# Patient Record
Sex: Female | Born: 1935 | Race: White | Hispanic: No | Smoking: Former smoker
Health system: Southern US, Community
[De-identification: ages and names within clinical notes are randomized; demographics above are authoritative.]

## PROBLEM LIST (undated history)

## (undated) DIAGNOSIS — G250 Essential tremor: Secondary | ICD-10-CM

## (undated) DIAGNOSIS — G459 Transient cerebral ischemic attack, unspecified: Secondary | ICD-10-CM

## (undated) DIAGNOSIS — M1990 Unspecified osteoarthritis, unspecified site: Secondary | ICD-10-CM

## (undated) DIAGNOSIS — N63 Unspecified lump in unspecified breast: Secondary | ICD-10-CM

## (undated) DIAGNOSIS — J449 Chronic obstructive pulmonary disease, unspecified: Secondary | ICD-10-CM

## (undated) DIAGNOSIS — F32A Depression, unspecified: Secondary | ICD-10-CM

## (undated) DIAGNOSIS — K219 Gastro-esophageal reflux disease without esophagitis: Secondary | ICD-10-CM

## (undated) DIAGNOSIS — F329 Major depressive disorder, single episode, unspecified: Secondary | ICD-10-CM

## (undated) DIAGNOSIS — N390 Urinary tract infection, site not specified: Secondary | ICD-10-CM

## (undated) DIAGNOSIS — J302 Other seasonal allergic rhinitis: Secondary | ICD-10-CM

## (undated) DIAGNOSIS — K5790 Diverticulosis of intestine, part unspecified, without perforation or abscess without bleeding: Secondary | ICD-10-CM

## (undated) DIAGNOSIS — I10 Essential (primary) hypertension: Secondary | ICD-10-CM

## (undated) DIAGNOSIS — E0590 Thyrotoxicosis, unspecified without thyrotoxic crisis or storm: Secondary | ICD-10-CM

## (undated) DIAGNOSIS — Z83518 Family history of other specified eye disorder: Secondary | ICD-10-CM

## (undated) HISTORY — PX: NEUROMA SURGERY: SHX722

## (undated) HISTORY — PX: OTHER SURGICAL HISTORY: SHX169

## (undated) HISTORY — DX: Family history of other specified eye disorder: Z83.518

## (undated) HISTORY — DX: Major depressive disorder, single episode, unspecified: F32.9

## (undated) HISTORY — DX: Unspecified osteoarthritis, unspecified site: M19.90

## (undated) HISTORY — DX: Chronic obstructive pulmonary disease, unspecified: J44.9

## (undated) HISTORY — PX: BREAST LUMPECTOMY: SHX2

## (undated) HISTORY — PX: BREAST CYST ASPIRATION: SHX578

## (undated) HISTORY — DX: Other seasonal allergic rhinitis: J30.2

## (undated) HISTORY — DX: Depression, unspecified: F32.A

## (undated) HISTORY — DX: Urinary tract infection, site not specified: N39.0

## (undated) HISTORY — DX: Unspecified lump in unspecified breast: N63.0

## (undated) HISTORY — DX: Transient cerebral ischemic attack, unspecified: G45.9

## (undated) HISTORY — DX: Thyrotoxicosis, unspecified without thyrotoxic crisis or storm: E05.90

## (undated) HISTORY — PX: DILATION AND CURETTAGE OF UTERUS: SHX78

## (undated) HISTORY — PX: APPENDECTOMY: SHX54

## (undated) HISTORY — DX: Diverticulosis of intestine, part unspecified, without perforation or abscess without bleeding: K57.90

## (undated) HISTORY — DX: Essential tremor: G25.0

## (undated) HISTORY — DX: Essential (primary) hypertension: I10

## (undated) HISTORY — DX: Gastro-esophageal reflux disease without esophagitis: K21.9

---

## 1973-01-26 HISTORY — PX: VESICOVAGINAL FISTULA CLOSURE W/ TAH: SUR271

## 1974-01-26 HISTORY — PX: OTHER SURGICAL HISTORY: SHX169

## 1975-01-27 HISTORY — PX: OTHER SURGICAL HISTORY: SHX169

## 1988-01-27 HISTORY — PX: TUMOR REMOVAL: SHX12

## 1998-01-26 HISTORY — PX: NECK SURGERY: SHX720

## 2004-10-03 ENCOUNTER — Ambulatory Visit: Payer: Self-pay | Admitting: Family Medicine

## 2005-10-29 ENCOUNTER — Ambulatory Visit: Payer: Self-pay | Admitting: Family Medicine

## 2006-01-26 HISTORY — PX: CATARACT EXTRACTION: SUR2

## 2006-06-09 ENCOUNTER — Ambulatory Visit: Payer: Self-pay | Admitting: Unknown Physician Specialty

## 2006-08-09 ENCOUNTER — Ambulatory Visit: Payer: Self-pay | Admitting: Ophthalmology

## 2006-08-09 ENCOUNTER — Other Ambulatory Visit: Payer: Self-pay

## 2006-08-16 ENCOUNTER — Ambulatory Visit: Payer: Self-pay | Admitting: Ophthalmology

## 2006-10-12 ENCOUNTER — Ambulatory Visit: Payer: Self-pay | Admitting: Ophthalmology

## 2006-10-18 ENCOUNTER — Ambulatory Visit: Payer: Self-pay | Admitting: Ophthalmology

## 2006-11-11 ENCOUNTER — Ambulatory Visit: Payer: Self-pay | Admitting: Family Medicine

## 2006-11-19 ENCOUNTER — Ambulatory Visit: Payer: Self-pay | Admitting: Family Medicine

## 2007-04-19 ENCOUNTER — Ambulatory Visit: Payer: Self-pay | Admitting: General Surgery

## 2007-12-28 ENCOUNTER — Ambulatory Visit: Payer: Self-pay | Admitting: Family Medicine

## 2008-01-11 ENCOUNTER — Ambulatory Visit: Payer: Self-pay | Admitting: Family Medicine

## 2009-01-14 ENCOUNTER — Ambulatory Visit: Payer: Self-pay | Admitting: Family Medicine

## 2009-01-17 ENCOUNTER — Ambulatory Visit: Payer: Self-pay | Admitting: Family Medicine

## 2009-06-28 ENCOUNTER — Emergency Department: Payer: Self-pay | Admitting: Internal Medicine

## 2009-09-10 ENCOUNTER — Ambulatory Visit: Payer: Self-pay | Admitting: General Surgery

## 2010-02-25 ENCOUNTER — Ambulatory Visit: Payer: Self-pay | Admitting: Family Medicine

## 2010-04-27 HISTORY — PX: RADIOFREQUENCY ABLATION: SHX2290

## 2010-04-29 ENCOUNTER — Ambulatory Visit: Payer: Self-pay

## 2010-05-12 ENCOUNTER — Ambulatory Visit: Payer: Self-pay

## 2010-05-28 ENCOUNTER — Inpatient Hospital Stay: Payer: Self-pay | Admitting: Specialist

## 2010-11-04 ENCOUNTER — Ambulatory Visit: Payer: Self-pay | Admitting: Ophthalmology

## 2011-01-15 ENCOUNTER — Emergency Department: Payer: Self-pay | Admitting: *Deleted

## 2011-01-23 ENCOUNTER — Emergency Department: Payer: Self-pay | Admitting: Emergency Medicine

## 2011-03-03 ENCOUNTER — Institutional Professional Consult (permissible substitution): Payer: Self-pay | Admitting: Critical Care Medicine

## 2011-03-05 ENCOUNTER — Encounter: Payer: Self-pay | Admitting: Pulmonary Disease

## 2011-03-06 ENCOUNTER — Ambulatory Visit (INDEPENDENT_AMBULATORY_CARE_PROVIDER_SITE_OTHER)
Admission: RE | Admit: 2011-03-06 | Discharge: 2011-03-06 | Disposition: A | Discharge status: Home / Self Care | Payer: Medicare Other | Source: Ambulatory Visit | Attending: Critical Care Medicine | Admitting: Critical Care Medicine

## 2011-03-06 ENCOUNTER — Encounter: Payer: Self-pay | Admitting: Critical Care Medicine

## 2011-03-06 ENCOUNTER — Ambulatory Visit (INDEPENDENT_AMBULATORY_CARE_PROVIDER_SITE_OTHER): Payer: Medicare Other | Admitting: Critical Care Medicine

## 2011-03-06 DIAGNOSIS — N390 Urinary tract infection, site not specified: Secondary | ICD-10-CM | POA: Insufficient documentation

## 2011-03-06 DIAGNOSIS — G250 Essential tremor: Secondary | ICD-10-CM

## 2011-03-06 DIAGNOSIS — F329 Major depressive disorder, single episode, unspecified: Secondary | ICD-10-CM

## 2011-03-06 DIAGNOSIS — K5792 Diverticulitis of intestine, part unspecified, without perforation or abscess without bleeding: Secondary | ICD-10-CM | POA: Insufficient documentation

## 2011-03-06 DIAGNOSIS — J309 Allergic rhinitis, unspecified: Secondary | ICD-10-CM

## 2011-03-06 DIAGNOSIS — E0590 Thyrotoxicosis, unspecified without thyrotoxic crisis or storm: Secondary | ICD-10-CM

## 2011-03-06 DIAGNOSIS — I10 Essential (primary) hypertension: Secondary | ICD-10-CM

## 2011-03-06 DIAGNOSIS — K5732 Diverticulitis of large intestine without perforation or abscess without bleeding: Secondary | ICD-10-CM

## 2011-03-06 DIAGNOSIS — J449 Chronic obstructive pulmonary disease, unspecified: Secondary | ICD-10-CM | POA: Insufficient documentation

## 2011-03-06 DIAGNOSIS — M1990 Unspecified osteoarthritis, unspecified site: Secondary | ICD-10-CM | POA: Insufficient documentation

## 2011-03-06 DIAGNOSIS — G459 Transient cerebral ischemic attack, unspecified: Secondary | ICD-10-CM

## 2011-03-06 DIAGNOSIS — K219 Gastro-esophageal reflux disease without esophagitis: Secondary | ICD-10-CM | POA: Insufficient documentation

## 2011-03-06 DIAGNOSIS — J302 Other seasonal allergic rhinitis: Secondary | ICD-10-CM | POA: Insufficient documentation

## 2011-03-06 MED ORDER — IPRATROPIUM-ALBUTEROL 0.5-2.5 (3) MG/3ML IN SOLN: RESPIRATORY_TRACT | Status: DC

## 2011-03-06 MED ORDER — AMLODIPINE BESYLATE 5 MG PO TABS: ORAL_TABLET | ORAL | Status: DC

## 2011-03-06 MED ORDER — NITROFURANTOIN MONOHYD MACRO 100 MG PO CAPS: ORAL_CAPSULE | ORAL | Status: DC

## 2011-03-06 NOTE — Progress Notes (Signed)
Subjective:    Patient ID: [name], female    DOB: [birth year], 78 y.o.   MRN: [id number]  Shortness of Breath This is a chronic problem. The current episode started more than 1 year ago. The problem occurs constantly. The problem has been gradually worsening. Associated symptoms include leg swelling, orthopnea, PND, sputum production and wheezing. Pertinent negatives include no abdominal pain, chest pain, claudication, coryza, ear pain, fever, headaches, hemoptysis, leg pain, rhinorrhea or sore throat. The symptoms are aggravated by any activity, exercise and weather changes. Risk factors include smoking. She has tried beta agonist inhalers and ipratropium inhalers for the symptoms. The treatment provided moderate relief. Her past medical history is significant for chronic lung disease, COPD and pneumonia. There is no history of allergies, aspirin allergies, asthma, bronchiolitis, CAD, DVT, a heart failure, PE or a recent surgery.   Dx copd since 2002 On oxygen at night for 1.5 years,  Then in ED 12/2010 now 24/7    Past Medical History  Diagnosis Date  . Seasonal allergies   . Osteoarthritis     hands, cervical spine, hips  . Benign breast lumps     and cysts  . FH: cataracts   . Depression   . Diverticulosis   . Hypertension   . Hyperthyroidism     s/p ablation  . Depression   . TIA (transient ischemic attack)   . COPD (chronic obstructive pulmonary disease)   . GERD (gastroesophageal reflux disease)   . Essential tremor   . Chronic UTI      Family History  Problem Relation Age of Onset  . Hypertension Father   . Stroke Father   . Heart disease Mother   . Allergies    . Asthma Mother     as a child  . Heart disease      grandparents  . Hypertension      grandparents  . Cancer Brother     spine (sibling)  . Emphysema Brother   . Breast cancer Daughter   . Breast cancer Daughter      History   Social History  . Marital Status: Single    Spouse Name: N/A      Number of Children: 3  . Years of Education: N/A   Occupational History  . retired    Social History Main Topics  . Smoking status: Former Smoker -- 1.0 packs/day for 65 years    Types: Cigarettes    Quit date: 11/26/2009  . Smokeless tobacco: Never Used  . Alcohol Use: Yes     occasionally wine on Sunday night  . Drug Use: Not on file  . Sexually Active: Not on file   Other Topics Concern  . Not on file   Social History Narrative  . No narrative on file     Allergies  Allergen Reactions  . Aspirin   . Bactrim     Severe stomach cramps and diarrhea  . Keppra     Fatigue, onset 06/13/10  . Lactose Intolerance (Gi)   . Penicillins   . Tape      Outpatient Prescriptions Prior to Visit  Medication Sig Dispense Refill  . aspirin 81 MG tablet Take 160 mg by mouth daily.      . Calcium Carbonate (CALCIUM 500 PO) Take 1 tablet by mouth 2 (two) times daily.      . celecoxib (CELEBREX) 200 MG capsule Take 200 mg by mouth 2 (two) times daily.      Marland Kitchen  estrogens, conjugated, (PREMARIN) 0.3 MG tablet Take 0.3 mg by mouth 3 (three) times a week. Take daily for 21 days then do not take for 7 days.      . fluocinonide cream (LIDEX) 0.05 % Apply topically as needed.      Marland Kitchen ipratropium (ATROVENT) 0.03 % nasal spray Place into the nose. 1-2 times daily as needed      . Lysine HCl 500 MG TABS Take by mouth 2 (two) times daily.      . Naftifine HCl (NAFTIN) 1 % GEL Apply topically as needed.       Marland Kitchen omeprazole (PRILOSEC) 20 MG capsule Take 20 mg by mouth daily.      . propranolol (INDERAL) 20 MG tablet Take 20 mg by mouth 2 (two) times daily.      . traMADol (ULTRAM) 50 MG tablet Take 50 mg by mouth every 6 (six) hours as needed.      . valsartan (DIOVAN) 160 MG tablet Take 160 mg by mouth 2 (two) times daily.      Marland Kitchen aMILoride (MIDAMOR) 5 MG tablet Take 5 mg by mouth daily.      Marland Kitchen amLODipine (NORVASC) 5 MG tablet Take 5 mg by mouth daily.      . clobetasol cream (TEMOVATE) 0.05 % Apply  topically as needed.      . Multiple Vitamins-Minerals (MULTIVITAMIN PO) Take by mouth.      . Potassium Bicarb-Citric Acid (EFFER-K) 10 MEQ TBEF Take 1 tablet by mouth daily.      . Tamsulosin HCl (FLOMAX) 0.4 MG CAPS Take 0.4 mg by mouth daily.      Marland Kitchen tiotropium (SPIRIVA) 18 MCG inhalation capsule Place 18 mcg into inhaler and inhale daily.          Review of Systems  Constitutional: Positive for fatigue. Negative for fever.  HENT: Negative for ear pain, sore throat and rhinorrhea.   Respiratory: Positive for sputum production, shortness of breath and wheezing. Negative for hemoptysis.   Cardiovascular: Positive for orthopnea, leg swelling and PND. Negative for chest pain and claudication.  Gastrointestinal: Negative for abdominal pain.       No n/v  No heartburn  Genitourinary: Positive for difficulty urinating.  Neurological: Negative for headaches.       Objective:   Physical Exam Filed Vitals:   03/06/11 1337  BP: 124/72  Pulse: 77  Temp: 97.5 F (36.4 C)  TempSrc: Oral  SpO2: 94%    Gen: Pleasant, well-nourished, in no distress,  normal affect  ENT: No lesions,  mouth clear,  oropharynx clear, no postnasal drip  Neck: No JVD, no TMG, no carotid bruits  Lungs: No use of accessory muscles, no dullness to percussion, distant BS  Cardiovascular: RRR, heart sounds normal, no murmur or gallops, no peripheral edema  Abdomen: soft and NT, no HSM,  BS normal  Musculoskeletal: No deformities, no cyanosis or clubbing  Neuro: alert, non focal  Skin: Warm, no lesions or rashes  Dg Chest 2 View  03/06/2011  *RADIOLOGY REPORT*  Clinical Data: Shortness of breath.  CHEST - 2 VIEW  Comparison: None  Findings: The cardiac silhouette, mediastinal and hilar contours are within normal limits.  There are emphysematous changes and probable chronic parenchymal scarring.  No definite acute overlying pulmonary process.  No pleural effusion.  The bony thorax is intact.  Remote healed  right rib fractures noted.  Probable loose ossified body in the right shoulder joint.  IMPRESSION: Emphysematous and scarring changes  without definite acute overlying pulmonary process.  Original Report Authenticated By: P. Loralie Champagne, M.D.          Assessment & Plan:   COPD (chronic obstructive pulmonary disease) Severe Copd golds IV with primary emphysema, norvasc exacerbating edema and hypoxemia in this setting. Macrobid is not the best chronic uti abx d/t lung disease.  Pt gets urinary retention on spirva and atrovent together Note CXR 03/06/11: NAD. Copd changes   Plan Stop Norvasc, macro bid, spiriva Increase DUOneb to three times daily Increase oxygen to 3Liters with exertion, 2Liter rest Referral to pulmonary rehab will be made Return 2 months     Updated Medication List Outpatient Encounter Prescriptions as of 03/06/2011  Medication Sig Dispense Refill  . amiloride-hydrochlorothiazide (MODURETIC) 5-50 MG tablet Take 1 tablet by mouth Daily.      Marland Kitchen amLODipine (NORVASC) 5 MG tablet HOLD      . aspirin 81 MG tablet Take 160 mg by mouth daily.      . Calcium Carbonate (CALCIUM 500 PO) Take 1 tablet by mouth 2 (two) times daily.      . celecoxib (CELEBREX) 200 MG capsule Take 200 mg by mouth 2 (two) times daily.      Marland Kitchen estrogens, conjugated, (PREMARIN) 0.3 MG tablet Take 0.3 mg by mouth 3 (three) times a week. Take daily for 21 days then do not take for 7 days.      . fluocinonide cream (LIDEX) 0.05 % Apply topically as needed.      Marland Kitchen ipratropium (ATROVENT) 0.03 % nasal spray Place into the nose. 1-2 times daily as needed      . ipratropium-albuterol (DUONEB) 0.5-2.5 (3) MG/3ML SOLN 3 times daily     And as needed  360 mL    . Lysine HCl 500 MG TABS Take by mouth 2 (two) times daily.      . Naftifine HCl (NAFTIN) 1 % GEL Apply topically as needed.       . nitrofurantoin, macrocrystal-monohydrate, (MACROBID) 100 MG capsule HOLD      . omeprazole (PRILOSEC) 20 MG capsule Take  20 mg by mouth daily.      . potassium chloride (MICRO-K) 10 MEQ CR capsule Take 1 tablet by mouth.      . propranolol (INDERAL) 20 MG tablet Take 20 mg by mouth 2 (two) times daily.      . traMADol (ULTRAM) 50 MG tablet Take 50 mg by mouth every 6 (six) hours as needed.      . valsartan (DIOVAN) 160 MG tablet Take 160 mg by mouth 2 (two) times daily.      Marland Kitchen DISCONTD: aMILoride (MIDAMOR) 5 MG tablet Take 5 mg by mouth daily.      Marland Kitchen DISCONTD: amLODipine (NORVASC) 5 MG tablet Take 5 mg by mouth daily.      Marland Kitchen DISCONTD: clobetasol cream (TEMOVATE) 0.05 % Apply topically as needed.      Marland Kitchen DISCONTD: ipratropium-albuterol (DUONEB) 0.5-2.5 (3) MG/3ML SOLN Take 3 mLs by nebulization. 3-4 times daily as needed      . DISCONTD: ipratropium-albuterol (DUONEB) 0.5-2.5 (3) MG/3ML SOLN 3 times daily  And as needed  360 mL    . DISCONTD: Multiple Vitamins-Minerals (MULTIVITAMIN PO) Take by mouth.      . DISCONTD: nitrofurantoin, macrocrystal-monohydrate, (MACROBID) 100 MG capsule Take 1 capsule by mouth Daily.      Marland Kitchen DISCONTD: Potassium Bicarb-Citric Acid (EFFER-K) 10 MEQ TBEF Take 1 tablet by mouth daily.      Marland Kitchen  DISCONTD: Tamsulosin HCl (FLOMAX) 0.4 MG CAPS Take 0.4 mg by mouth daily.      Marland Kitchen DISCONTD: tiotropium (SPIRIVA) 18 MCG inhalation capsule Place 18 mcg into inhaler and inhale daily.      Marland Kitchen DISCONTD: topiramate (TOPAMAX) 25 MG tablet Take 2 tablets by mouth Daily.

## 2011-03-06 NOTE — Patient Instructions (Addendum)
Stop Norvasc, macro bid, spiriva Increase DUOneb to three times daily Increase oxygen to 3Liters with exertion, 2Liter rest Referral to pulmonary rehab will be made Return 2 months I will call your primary MD about medication issues Chest xray today

## 2011-03-06 NOTE — Progress Notes (Signed)
Quick Note:  Notify the patient that the Xray is stable and no pneumonia. Only Copd changes. No lung cancer No change in medications are recommended. Continue current meds as prescribed at last office visit ______

## 2011-03-06 NOTE — Progress Notes (Signed)
  Subjective:    Patient ID: [name], female    DOB: 1921-12-12, 78 y.o.   MRN: [id number]  HPI    Review of Systems  Constitutional: Positive for chills, activity change, fatigue and unexpected weight change. Negative for fever, diaphoresis and appetite change.  HENT: Positive for hearing loss, congestion, sore throat, rhinorrhea, sneezing, mouth sores, trouble swallowing, neck pain, neck stiffness and postnasal drip. Negative for ear pain, nosebleeds, facial swelling, dental problem, voice change, sinus pressure, tinnitus and ear discharge.   Eyes: Positive for discharge and itching. Negative for photophobia and visual disturbance.  Respiratory: Positive for shortness of breath and wheezing. Negative for apnea, cough, choking, chest tightness and stridor.   Cardiovascular: Positive for leg swelling. Negative for chest pain and palpitations.  Gastrointestinal: Positive for constipation and abdominal distention. Negative for nausea, vomiting, abdominal pain and blood in stool.  Genitourinary: Positive for difficulty urinating. Negative for dysuria, urgency, frequency, hematuria, flank pain and decreased urine volume.  Musculoskeletal: Positive for gait problem. Negative for myalgias, back pain, joint swelling and arthralgias.  Skin: Negative for color change, pallor and rash.  Neurological: Positive for dizziness, tremors and weakness. Negative for seizures, syncope, speech difficulty, light-headedness, numbness and headaches.  Hematological: Negative for adenopathy. Bruises/bleeds easily.  Psychiatric/Behavioral: Positive for confusion and sleep disturbance. Negative for agitation. The patient is nervous/anxious.        Objective:   Physical Exam        Assessment & Plan:

## 2011-03-07 NOTE — Assessment & Plan Note (Signed)
Severe Copd golds IV with primary emphysema, norvasc exacerbating edema and hypoxemia in this setting. Macrobid is not the best chronic uti abx d/t lung disease.  Pt gets urinary retention on spirva and atrovent together Note CXR 03/06/11: NAD. Copd changes   Plan Stop Norvasc, macro bid, spiriva Increase DUOneb to three times daily Increase oxygen to 3Liters with exertion, 2Liter rest Referral to pulmonary rehab will be made Return 2 months

## 2011-05-05 ENCOUNTER — Encounter: Payer: Self-pay | Admitting: Critical Care Medicine

## 2011-05-11 ENCOUNTER — Ambulatory Visit: Payer: Self-pay | Admitting: Oncology

## 2011-05-11 ENCOUNTER — Telehealth: Payer: Self-pay | Admitting: Critical Care Medicine

## 2011-05-11 ENCOUNTER — Encounter: Payer: Self-pay | Admitting: Critical Care Medicine

## 2011-05-11 ENCOUNTER — Ambulatory Visit (INDEPENDENT_AMBULATORY_CARE_PROVIDER_SITE_OTHER): Payer: Medicare Other | Admitting: Critical Care Medicine

## 2011-05-11 VITALS — BP 142/70 | HR 81 | Temp 98.0°F | Ht 62.5 in | Wt 137.8 lb

## 2011-05-11 DIAGNOSIS — J4489 Other specified chronic obstructive pulmonary disease: Secondary | ICD-10-CM

## 2011-05-11 DIAGNOSIS — J449 Chronic obstructive pulmonary disease, unspecified: Secondary | ICD-10-CM

## 2011-05-11 LAB — CBC CANCER CENTER
Basophil #: 0 x10 3/mm (ref 0.0–0.1)
Eosinophil #: 0.1 x10 3/mm (ref 0.0–0.7)
HCT: 39.9 % (ref 35.0–47.0)
HGB: 13.1 g/dL (ref 12.0–16.0)
Lymphocyte %: 25.7 %
MCV: 99 fL (ref 80–100)
Monocyte %: 11.2 %
Neutrophil #: 3.1 x10 3/mm (ref 1.4–6.5)
Platelet: 154 x10 3/mm (ref 150–440)
RBC: 4.01 10*6/uL (ref 3.80–5.20)
RDW: 12.9 % (ref 11.5–14.5)
WBC: 5.1 x10 3/mm (ref 3.6–11.0)

## 2011-05-11 LAB — COMPREHENSIVE METABOLIC PANEL
Albumin: 4 g/dL (ref 3.4–5.0)
Anion Gap: 5 — ABNORMAL LOW (ref 7–16)
Calcium, Total: 9.3 mg/dL (ref 8.5–10.1)
Chloride: 93 mmol/L — ABNORMAL LOW (ref 98–107)
Co2: 38 mmol/L — ABNORMAL HIGH (ref 21–32)
EGFR (African American): >60
SGPT (ALT): 25 U/L
Total Protein: 7.5 g/dL (ref 6.4–8.2)

## 2011-05-11 LAB — PROTIME-INR: Prothrombin Time: 11.9 secs (ref 11.5–14.7)

## 2011-05-11 MED ORDER — IPRATROPIUM-ALBUTEROL 0.5-2.5 (3) MG/3ML IN SOLN: RESPIRATORY_TRACT | Status: DC

## 2011-05-11 MED ORDER — NEBIVOLOL HCL 10 MG PO TABS: 10.0000 mg | ORAL_TABLET | Freq: Every day | ORAL | Status: DC

## 2011-05-11 MED ORDER — CHLORPHENIRAMINE MALEATE 12 MG PO CP24: 12.0000 mg | ORAL_CAPSULE | Freq: Every day | ORAL | Status: DC

## 2011-05-11 NOTE — Patient Instructions (Signed)
Try Chlorpheniramine 12mg  at bedtime for nasal drainage Increase duoneb to 4 times daily Stop inderal  Start bystolic one daily Return 2 months

## 2011-05-11 NOTE — Assessment & Plan Note (Addendum)
Severe Copd golds IV/D  with primary emphysema, Now off macrobid and norvasc but still on non selective BB inderal Also severe pndrip syndrome d/t allergic rhinitis Plan Try Chlorpheniramine 12mg  at bedtime for nasal drainage Increase duoneb to 4 times daily Stop inderal  Start bystolic one daily 10mg  Return 2 months

## 2011-05-11 NOTE — Telephone Encounter (Signed)
I spoke with pt and she is requesting todays OV note be faxed to the doctor names above at the [facility]. Since PW has not signed off on note will forward to his nurse to f/u on when this is done. Please advise crystal thanks

## 2011-05-11 NOTE — Progress Notes (Signed)
Subjective:    Patient ID: [name], female    DOB: 14-Oct-1921, 78 y.o.   MRN: [id number]  Shortness of Breath This is a chronic problem. The current episode started more than 1 year ago. The problem occurs constantly. The problem has been gradually worsening. Associated symptoms include leg swelling, orthopnea, PND, sputum production and wheezing. Pertinent negatives include no abdominal pain, chest pain, claudication, coryza, ear pain, fever, headaches, hemoptysis, leg pain, rhinorrhea or sore throat. The symptoms are aggravated by any activity, exercise and weather changes. Risk factors include smoking. She has tried beta agonist inhalers and ipratropium inhalers for the symptoms. The treatment provided moderate relief. Her past medical history is significant for chronic lung disease, COPD and pneumonia. There is no history of allergies, aspirin allergies, asthma, bronchiolitis, CAD, DVT, a heart failure, PE or a recent surgery.   Dx copd since 2002 On oxygen at night for 1.5 years,  Then in ED 12/2010 now 24/7    4/15 Now nose runs constantly, severe dyspnea. Uses duoneb qid Not in rehab yet Stomach issues.  Thickening in stomach.   Pt denies any significant sore throat, nasal congestion or excess secretions, fever, chills, sweats, unintended weight loss, pleurtic or exertional chest pain, orthopnea PND, or leg swelling Pt denies any increase in rescue therapy over baseline, denies waking up needing it or having any early am or nocturnal exacerbations of coughing/wheezing/or dyspnea. Pt also denies any obvious fluctuation in symptoms with  weather or environmental change or other alleviating or aggravating factors   Past Medical History  Diagnosis Date  . Seasonal allergies   . Osteoarthritis     hands, cervical spine, hips  . Benign breast lumps     and cysts  . FH: cataracts   . Depression   . Diverticulosis   . Hypertension   . Hyperthyroidism     s/p ablation  .  Depression   . TIA (transient ischemic attack)   . COPD (chronic obstructive pulmonary disease)   . GERD (gastroesophageal reflux disease)   . Essential tremor   . Chronic UTI      Family History  Problem Relation Age of Onset  . Hypertension Father   . Stroke Father   . Heart disease Mother   . Allergies    . Asthma Mother     as a child  . Heart disease      grandparents  . Hypertension      grandparents  . Cancer Brother     spine (sibling)  . Emphysema Brother   . Breast cancer Daughter   . Breast cancer Daughter      History   Social History  . Marital Status: Single    Spouse Name: N/A    Number of Children: 3  . Years of Education: N/A   Occupational History  . retired    Social History Main Topics  . Smoking status: Former Smoker -- 1.0 packs/day for 65 years    Types: Cigarettes    Quit date: 11/26/2009  . Smokeless tobacco: Never Used  . Alcohol Use: Yes     occasionally wine on Sunday night  . Drug Use: Not on file  . Sexually Active: Not on file   Other Topics Concern  . Not on file   Social History Narrative  . No narrative on file     Allergies  Allergen Reactions  . Aspirin   . Bactrim     Severe stomach cramps and diarrhea  .  Keppra     Fatigue, onset 06/13/10  . Lactose Intolerance (Gi)   . Penicillins   . Tape      Outpatient Prescriptions Prior to Visit  Medication Sig Dispense Refill  . amiloride-hydrochlorothiazide (MODURETIC) 5-50 MG tablet Take 1 tablet by mouth Daily.      Marland Kitchen aspirin 81 MG tablet Take 160 mg by mouth daily.      . Calcium Carbonate (CALCIUM 500 PO) Take 1 tablet by mouth 2 (two) times daily.      . celecoxib (CELEBREX) 200 MG capsule Take 200 mg by mouth 2 (two) times daily.      Marland Kitchen estrogens, conjugated, (PREMARIN) 0.3 MG tablet Take 0.3 mg by mouth 3 (three) times a week. .      . fluocinonide cream (LIDEX) 0.05 % Apply topically as needed.      Marland Kitchen ipratropium (ATROVENT) 0.03 % nasal spray Place into the  nose. 1-2 times daily as needed      . Lysine HCl 500 MG TABS Take by mouth 2 (two) times daily.      . Naftifine HCl (NAFTIN) 1 % GEL Apply topically as needed.       Marland Kitchen omeprazole (PRILOSEC) 20 MG capsule Take 20 mg by mouth daily.      . potassium chloride (MICRO-K) 10 MEQ CR capsule Take 1 tablet by mouth.      . traMADol (ULTRAM) 50 MG tablet Take 50 mg by mouth every 6 (six) hours as needed.      . valsartan (DIOVAN) 160 MG tablet Take 160 mg by mouth 2 (two) times daily.      Marland Kitchen ipratropium-albuterol (DUONEB) 0.5-2.5 (3) MG/3ML SOLN 3 times daily     And as needed  360 mL    . propranolol (INDERAL) 20 MG tablet Take 20 mg by mouth 2 (two) times daily.      Marland Kitchen amLODipine (NORVASC) 5 MG tablet HOLD      . nitrofurantoin, macrocrystal-monohydrate, (MACROBID) 100 MG capsule HOLD          Review of Systems  Constitutional: Positive for fatigue. Negative for fever.  HENT: Negative for ear pain, sore throat and rhinorrhea.   Respiratory: Positive for sputum production, shortness of breath and wheezing. Negative for hemoptysis.   Cardiovascular: Positive for orthopnea, leg swelling and PND. Negative for chest pain and claudication.  Gastrointestinal: Negative for abdominal pain.       No n/v  No heartburn  Genitourinary: Positive for difficulty urinating.  Neurological: Negative for headaches.       Objective:   Physical Exam  Filed Vitals:   05/11/11 1444  BP: 142/70  Pulse: 81  Temp: 98 F (36.7 C)  TempSrc: Oral  Height: 5' 2.5" (1.588 m)  Weight: 62.506 kg (137 lb 12.8 oz)  SpO2: 94%    Gen: Pleasant, well-nourished, in no distress,  normal affect  ENT: No lesions,  mouth clear,  oropharynx clear, no postnasal drip  Neck: No JVD, no TMG, no carotid bruits  Lungs: No use of accessory muscles, no dullness to percussion, distant BS  Cardiovascular: RRR, heart sounds normal, no murmur or gallops, no peripheral edema  Abdomen: soft and NT, no HSM,  BS  normal  Musculoskeletal: No deformities, no cyanosis or clubbing  Neuro: alert, non focal  Skin: Warm, no lesions or rashes         Assessment & Plan:   COPD (chronic obstructive pulmonary disease) Severe Copd golds IV/D  with primary  emphysema, Now off macrobid and norvasc but still on non selective BB inderal Also severe pndrip syndrome d/t allergic rhinitis Plan Try Chlorpheniramine 12mg  at bedtime for nasal drainage Increase duoneb to 4 times daily Stop inderal  Start bystolic one daily 10mg  Return 2 months        Updated Medication List Outpatient Encounter Prescriptions as of 05/11/2011  Medication Sig Dispense Refill  . amiloride-hydrochlorothiazide (MODURETIC) 5-50 MG tablet Take 1 tablet by mouth Daily.      Marland Kitchen aspirin 81 MG tablet Take 160 mg by mouth daily.      . Calcium Carbonate (CALCIUM 500 PO) Take 1 tablet by mouth 2 (two) times daily.      . celecoxib (CELEBREX) 200 MG capsule Take 200 mg by mouth 2 (two) times daily.      Marland Kitchen estrogens, conjugated, (PREMARIN) 0.3 MG tablet Take 0.3 mg by mouth 3 (three) times a week. .      . fluocinonide cream (LIDEX) 0.05 % Apply topically as needed.      Marland Kitchen ipratropium (ATROVENT) 0.03 % nasal spray Place into the nose. 1-2 times daily as needed      . ipratropium-albuterol (DUONEB) 0.5-2.5 (3) MG/3ML SOLN 4  times daily     And as needed  360 mL    . Lysine HCl 500 MG TABS Take by mouth 2 (two) times daily.      . Naftifine HCl (NAFTIN) 1 % GEL Apply topically as needed.       Marland Kitchen omeprazole (PRILOSEC) 20 MG capsule Take 20 mg by mouth daily.      . potassium chloride (MICRO-K) 10 MEQ CR capsule Take 1 tablet by mouth.      . traMADol (ULTRAM) 50 MG tablet Take 50 mg by mouth every 6 (six) hours as needed.      . valsartan (DIOVAN) 160 MG tablet Take 160 mg by mouth 2 (two) times daily.      Marland Kitchen DISCONTD: ipratropium-albuterol (DUONEB) 0.5-2.5 (3) MG/3ML SOLN 3 times daily     And as needed  360 mL    . DISCONTD:  propranolol (INDERAL) 20 MG tablet Take 20 mg by mouth 2 (two) times daily.      . Chlorpheniramine Maleate 12 MG CP24 Take 1 capsule (12 mg total) by mouth at bedtime.  30 each  6  . nebivolol (BYSTOLIC) 10 MG tablet Take 1 tablet (10 mg total) by mouth daily.  30 tablet  6  . DISCONTD: amLODipine (NORVASC) 5 MG tablet HOLD      . DISCONTD: nitrofurantoin, macrocrystal-monohydrate, (MACROBID) 100 MG capsule HOLD

## 2011-05-12 NOTE — Telephone Encounter (Signed)
Pt informed that ov note was sent to Dr Gwen Pounds at [facility].

## 2011-05-20 ENCOUNTER — Telehealth: Payer: Self-pay | Admitting: Critical Care Medicine

## 2011-05-20 NOTE — Telephone Encounter (Signed)
Received copies from  [employer] .05/21/11 Forwarded 8   pages to Dr.  Delford Field ,for review.

## 2011-05-25 ENCOUNTER — Telehealth: Payer: Self-pay | Admitting: Critical Care Medicine

## 2011-05-25 NOTE — Telephone Encounter (Signed)
I spoke with [name] and advised her we did have the surgical clearance and will fax over once PW signs this. She was fine with this and wanted to make sure we did receive this. Will sign off message

## 2011-05-27 ENCOUNTER — Ambulatory Visit: Payer: Self-pay | Admitting: Oncology

## 2011-05-27 ENCOUNTER — Encounter: Payer: Self-pay | Admitting: Critical Care Medicine

## 2011-05-27 ENCOUNTER — Telehealth: Payer: Self-pay | Admitting: Critical Care Medicine

## 2011-05-27 NOTE — Telephone Encounter (Signed)
Call Ms Arvilla Market , NP. Of GI dept in [facility]  Ms Northington can be cleared for propofol sedation for EGD as long as anesthesia is on standby and the patient is done in a hospital associated Endoscopy center.

## 2011-05-27 NOTE — Telephone Encounter (Signed)
Called GI at [facility], spoke with Amy, RN - Dr. Markham Jordan and Kim's nurse.  I informed her of below per Dr. Delford Field.  She would like this faxed to her attn at [phone] and will call back if anything further is needed.

## 2011-06-02 ENCOUNTER — Telehealth: Payer: Self-pay | Admitting: Critical Care Medicine

## 2011-06-02 IMAGING — US ULTRASOUND LEFT BREAST
1 series · 11 of 11 positions shown · non-contrast
Comparison: none

REASON FOR EXAM: lt mass
COMMENTS:

[Series 1: ultrasound left breast · 11 of 11 slices shown]
[im 1/11]
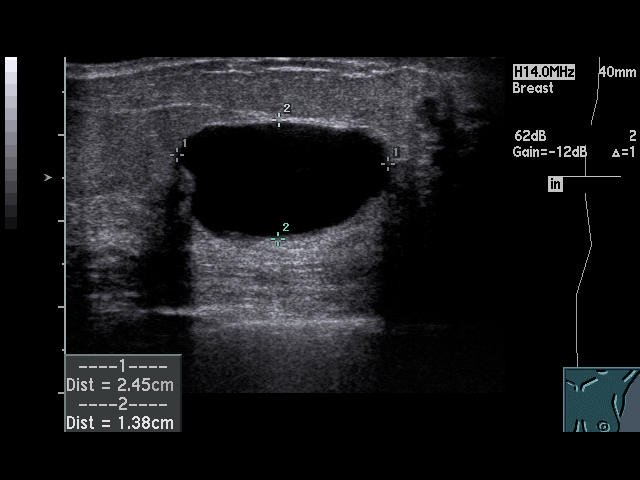
[im 2/11]
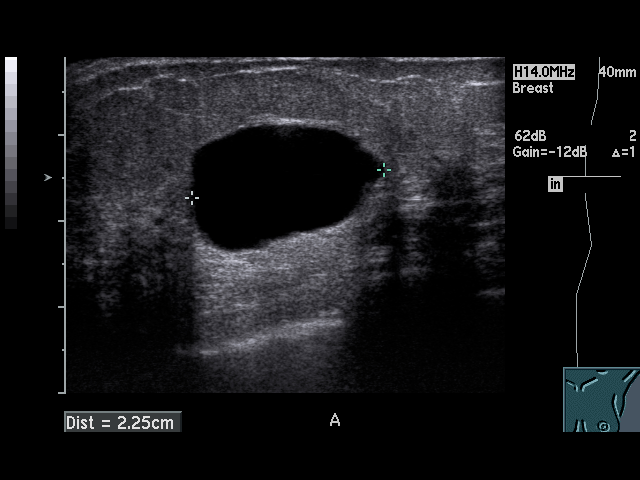
[im 3/11]
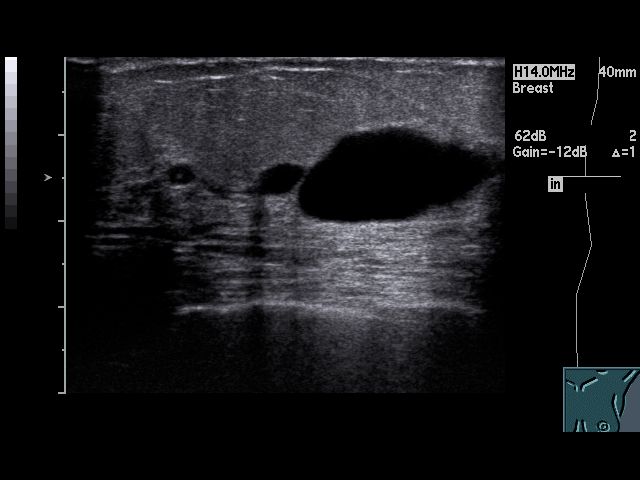
[im 4/11]
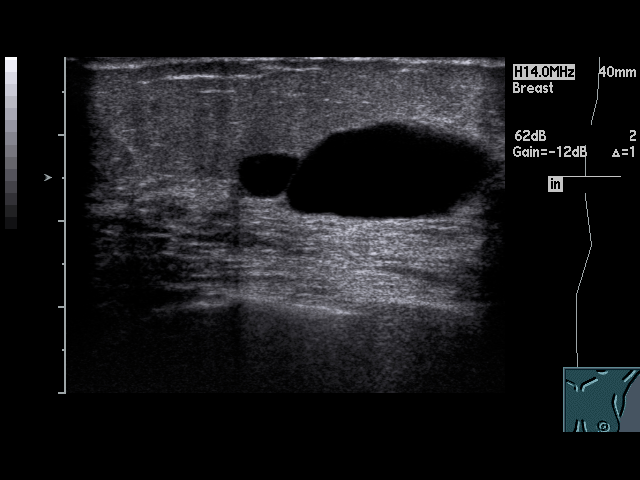
[im 5/11]
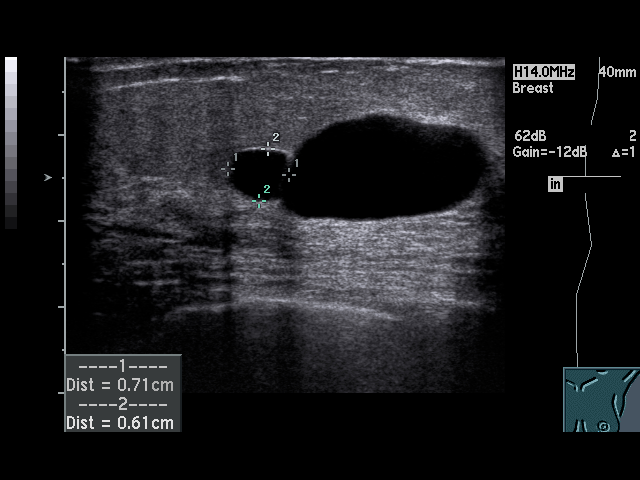
[im 6/11]
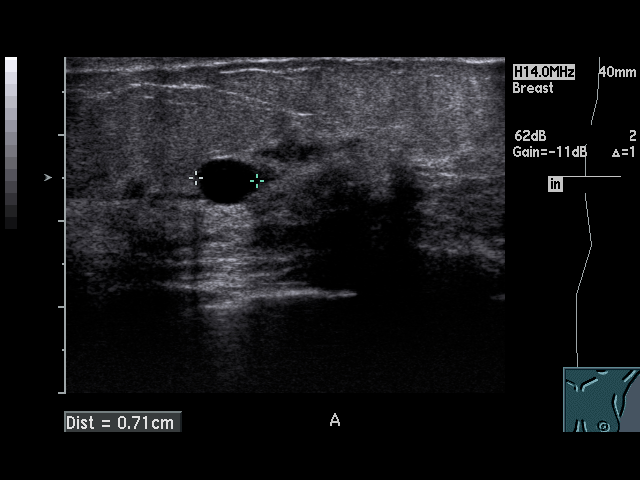
[im 7/11]
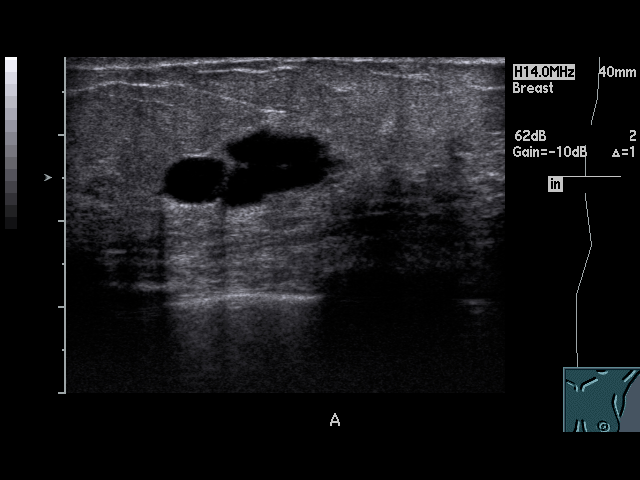
[im 8/11]
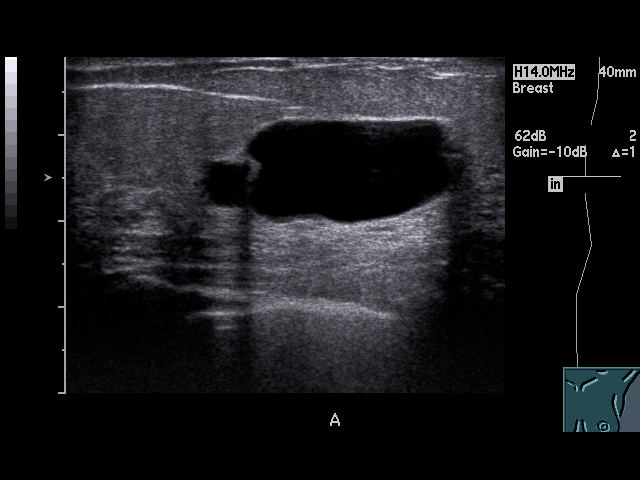
[im 9/11]
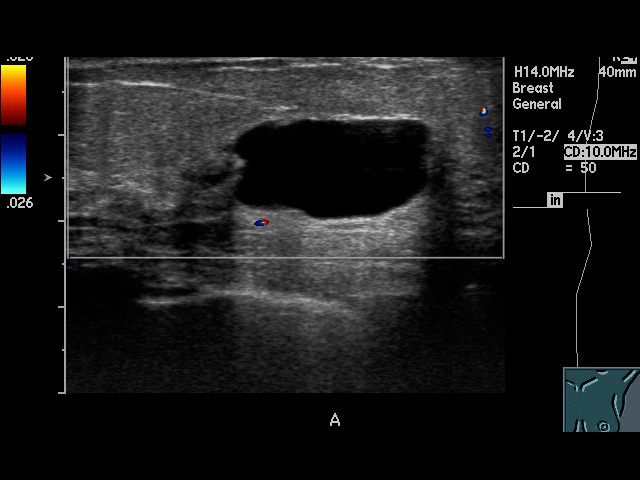
[im 10/11]
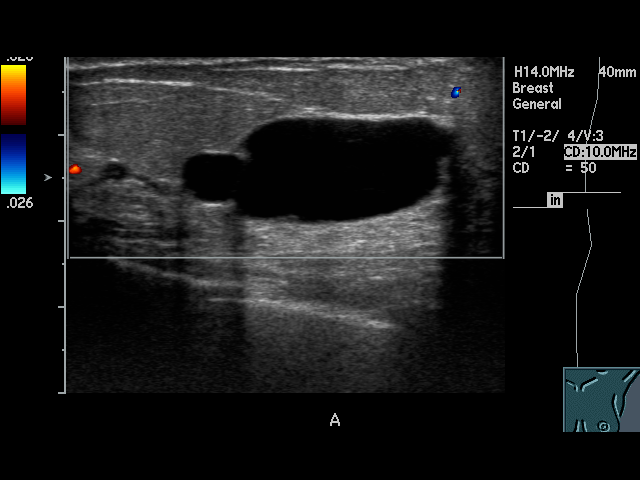
[im 11/11]
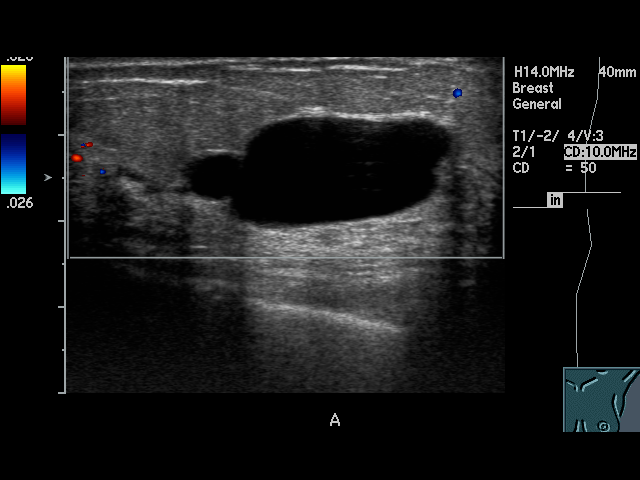

[11 of 11 positions shown; findings below may reference images not displayed]

PROCEDURE:     US  - US BREAST LEFT  - January 17, 2009  [DATE]

RESULT:     Real-time sonography is performed of the retroareolar left
breast. There is an anechoic, 3.5 x 1.4 x 2.2 cm mass in the left
retroareolar breast. There is increased through transmission. There is a
fairly smooth margin. There is a thick septation noted along a peripheral
aspect of the mass. No other septations are noted. There is no mural
nodularity.
IMPRESSION: Mildly complex cystic mass. Recommend cyst aspiration given
its interval enlargement and mildly thickened septations.

BI-RADS:  Category 4A  Finding needing intervention with a low suspicion for
malignancy.

## 2011-06-02 NOTE — Telephone Encounter (Signed)
Received copies from [facility] ,on 06/02/11 . Forwarded 7   pages to Dr. Delford Field ,for review.

## 2011-06-17 ENCOUNTER — Inpatient Hospital Stay: Payer: Self-pay | Admitting: Internal Medicine

## 2011-06-17 LAB — CK TOTAL AND CKMB (NOT AT ARMC)
CK, Total: 56 U/L (ref 21–215)
CK, Total: 45 U/L (ref 21–215)
CK-MB: 2.6 ng/mL (ref 0.5–3.6)
CK-MB: 2.3 ng/mL (ref 0.5–3.6)

## 2011-06-17 LAB — CBC
MCV: 98 fL (ref 80–100)
RBC: 3.76 10*6/uL — ABNORMAL LOW (ref 3.80–5.20)
RDW: 12.7 % (ref 11.5–14.5)
WBC: 7.5 10*3/uL (ref 3.6–11.0)

## 2011-06-17 LAB — BASIC METABOLIC PANEL
BUN: 23 mg/dL — ABNORMAL HIGH (ref 7–18)
EGFR (Non-African Amer.): >60
Osmolality: 276 (ref 275–301)

## 2011-06-17 LAB — TROPONIN I: Troponin-I: 0.07 ng/mL — ABNORMAL HIGH

## 2011-06-18 LAB — LIPID PANEL
Cholesterol: 149 mg/dL (ref 0–200)
Triglycerides: 49 mg/dL (ref 0–200)
VLDL Cholesterol, Calc: 10 mg/dL (ref 5–40)

## 2011-06-23 ENCOUNTER — Ambulatory Visit: Payer: Self-pay | Admitting: Family Medicine

## 2011-06-27 ENCOUNTER — Encounter: Payer: Self-pay | Admitting: Critical Care Medicine

## 2011-07-15 ENCOUNTER — Telehealth: Payer: Self-pay | Admitting: Critical Care Medicine

## 2011-07-15 ENCOUNTER — Ambulatory Visit: Payer: Medicare Other | Admitting: Critical Care Medicine

## 2011-07-15 ENCOUNTER — Ambulatory Visit (INDEPENDENT_AMBULATORY_CARE_PROVIDER_SITE_OTHER): Payer: Medicare Other | Admitting: Critical Care Medicine

## 2011-07-15 ENCOUNTER — Encounter: Payer: Self-pay | Admitting: Critical Care Medicine

## 2011-07-15 VITALS — BP 108/60 | HR 84 | Temp 97.7°F | Ht 62.0 in | Wt 137.8 lb

## 2011-07-15 DIAGNOSIS — J449 Chronic obstructive pulmonary disease, unspecified: Secondary | ICD-10-CM

## 2011-07-15 NOTE — Assessment & Plan Note (Signed)
Chronic obstructive lung disease with primary emphysematous component gold stage D. Recent exacerbation now resolved Plan Obtain records from recent hospitalization Maintain that treatment as prescribed Maintain oxygen therapy as prescribed

## 2011-07-15 NOTE — Telephone Encounter (Signed)
Forward to Dr. Shan Levans for review. 07-15-11 ym

## 2011-07-15 NOTE — Progress Notes (Signed)
Subjective:    Patient ID: [name], female    DOB: November 29, 1921, 78 y.o.   MRN: [id number]  HPI  4/15 Now nose runs constantly, severe dyspnea. Uses duoneb qid Not in rehab yet Stomach issues.  Thickening in stomach.   Pt denies any significant sore throat, nasal congestion or excess secretions, fever, chills, sweats, unintended weight loss, pleurtic or exertional chest pain, orthopnea PND, or leg swelling Pt denies any increase in rescue therapy over baseline, denies waking up needing it or having any early am or nocturnal exacerbations of coughing/wheezing/or dyspnea. Pt also denies any obvious fluctuation in symptoms with  weather or environmental change or other alleviating or aggravating factors  07/15/2011 Pt was in ED twice since last ov.  06/17/11 last visit.  Pt had acute dyspnea.  Pt Rx BD neb med. Pt then to ED. Ankles swollen.  Chest still with pain.  Bp high 174/87. Sats 97%the patient had sl wheeze then.  Since that ov now ok.  No cough. Now no wheeze.  Dyspnea is labored . Worse if carries any thing .   Past Medical History  Diagnosis Date  . Seasonal allergies   . Osteoarthritis     hands, cervical spine, hips  . Benign breast lumps     and cysts  . FH: cataracts   . Depression   . Diverticulosis   . Hypertension   . Hyperthyroidism     s/p ablation  . Depression   . TIA (transient ischemic attack)   . COPD (chronic obstructive pulmonary disease)   . GERD (gastroesophageal reflux disease)   . Essential tremor   . Chronic UTI      Family History  Problem Relation Age of Onset  . Hypertension Father   . Stroke Father   . Heart disease Mother   . Allergies    . Asthma Mother     as a child  . Heart disease      grandparents  . Hypertension      grandparents  . Cancer Brother     spine (sibling)  . Emphysema Brother   . Breast cancer Daughter   . Breast cancer Daughter      History   Social History  . Marital Status: Single    Spouse  Name: N/A    Number of Children: 3  . Years of Education: N/A   Occupational History  . retired    Social History Main Topics  . Smoking status: Former Smoker -- 1.0 packs/day for 65 years    Types: Cigarettes    Quit date: 11/26/2009  . Smokeless tobacco: Never Used  . Alcohol Use: Yes     occasionally wine on Sunday night  . Drug Use: Not on file  . Sexually Active: Not on file   Other Topics Concern  . Not on file   Social History Narrative  . No narrative on file     Allergies  Allergen Reactions  . Aspirin   . Bactrim     Severe stomach cramps and diarrhea  . Lactose Intolerance (Gi)   . Levetiracetam     Fatigue, onset 06/13/10  . Penicillins   . Septra (Sulfamethoxazole W-Trimethoprim)     Nausea and diarrhea  . Sulfa Antibiotics     Severe nausea  . Tape      Outpatient Prescriptions Prior to Visit  Medication Sig Dispense Refill  . amiloride-hydrochlorothiazide (MODURETIC) 5-50 MG tablet Take 1 tablet by mouth Daily.      Marland Kitchen  aspirin 81 MG tablet Take 160 mg by mouth daily.      . Calcium Carbonate (CALCIUM 500 PO) Take 1 tablet by mouth 2 (two) times daily.      . celecoxib (CELEBREX) 200 MG capsule Take 200 mg by mouth 2 (two) times daily.      Marland Kitchen estrogens, conjugated, (PREMARIN) 0.3 MG tablet Take 0.3 mg by mouth 3 (three) times a week. .      . fluocinonide cream (LIDEX) 0.05 % Apply topically as needed.      Marland Kitchen ipratropium (ATROVENT) 0.03 % nasal spray Place into the nose. 1-2 times daily as needed      . ipratropium-albuterol (DUONEB) 0.5-2.5 (3) MG/3ML SOLN 4  times daily     And as needed  360 mL    . Lysine HCl 500 MG TABS Take by mouth 2 (two) times daily.      . Naftifine HCl (NAFTIN) 1 % GEL Apply topically as needed.       . nebivolol (BYSTOLIC) 10 MG tablet Take 1 tablet (10 mg total) by mouth daily.  30 tablet  6  . omeprazole (PRILOSEC) 20 MG capsule Take 20 mg by mouth 2 (two) times daily.       . potassium chloride (MICRO-K) 10 MEQ CR  capsule Take 1 tablet by mouth.      . traMADol (ULTRAM) 50 MG tablet Take 50 mg by mouth every 6 (six) hours as needed.      . valsartan (DIOVAN) 160 MG tablet Take 160 mg by mouth 2 (two) times daily.      . Chlorpheniramine Maleate 12 MG CP24 Take 1 capsule (12 mg total) by mouth at bedtime.  30 each  6      Review of Systems  Constitutional: Positive for fatigue.  Gastrointestinal:       No n/v  No heartburn  Genitourinary: Positive for difficulty urinating.       Objective:   Physical Exam  Filed Vitals:   07/15/11 1507  BP: 108/60  Pulse: 84  Temp: 97.7 F (36.5 C)  TempSrc: Oral  Height: 5\' 2"  (1.575 m)  Weight: 137 lb 12.8 oz (62.506 kg)  SpO2: 92%    Gen: Pleasant, well-nourished, in no distress,  normal affect  ENT: No lesions,  mouth clear,  oropharynx clear, no postnasal drip  Neck: No JVD, no TMG, no carotid bruits  Lungs: No use of accessory muscles, no dullness to percussion, distant BS  Cardiovascular: RRR, heart sounds normal, no murmur or gallops, no peripheral edema  Abdomen: soft and NT, no HSM,  BS normal  Musculoskeletal: No deformities, no cyanosis or clubbing  Neuro: alert, non focal  Skin: Warm, no lesions or rashes         Assessment & Plan:   COPD (chronic obstructive pulmonary disease) Chronic obstructive lung disease with primary emphysematous component gold stage D. Recent exacerbation now resolved Plan Obtain records from recent hospitalization Maintain that treatment as prescribed Maintain oxygen therapy as prescribed    Updated Medication List Outpatient Encounter Prescriptions as of 07/15/2011  Medication Sig Dispense Refill  . amiloride-hydrochlorothiazide (MODURETIC) 5-50 MG tablet Take 1 tablet by mouth Daily.      Marland Kitchen aspirin 81 MG tablet Take 160 mg by mouth daily.      . Calcium Carbonate (CALCIUM 500 PO) Take 1 tablet by mouth 2 (two) times daily.      . celecoxib (CELEBREX) 200 MG capsule Take 200 mg by  mouth 2 (two) times daily.      . Chlorpheniramine Maleate 12 MG CP24 Take 4 mg by mouth 3 (three) times daily.      Marland Kitchen estrogens, conjugated, (PREMARIN) 0.3 MG tablet Take 0.3 mg by mouth 3 (three) times a week. .      . fluocinonide cream (LIDEX) 0.05 % Apply topically as needed.      Marland Kitchen ipratropium (ATROVENT) 0.03 % nasal spray Place into the nose. 1-2 times daily as needed      . ipratropium-albuterol (DUONEB) 0.5-2.5 (3) MG/3ML SOLN 4  times daily     And as needed  360 mL    . Lysine 500 MG TABS Take by mouth 2 (two) times daily.      Marland Kitchen Lysine HCl 500 MG TABS Take by mouth 2 (two) times daily.      . Naftifine HCl (NAFTIN) 1 % GEL Apply topically as needed.       . nebivolol (BYSTOLIC) 10 MG tablet Take 1 tablet (10 mg total) by mouth daily.  30 tablet  6  . omeprazole (PRILOSEC) 20 MG capsule Take 20 mg by mouth 2 (two) times daily.       . potassium chloride (MICRO-K) 10 MEQ CR capsule Take 1 tablet by mouth.      . traMADol (ULTRAM) 50 MG tablet Take 50 mg by mouth every 6 (six) hours as needed.      . valsartan (DIOVAN) 160 MG tablet Take 160 mg by mouth 2 (two) times daily.      Marland Kitchen DISCONTD: Chlorpheniramine Maleate 12 MG CP24 Take 1 capsule (12 mg total) by mouth at bedtime.  30 each  6  . DISCONTD: potassium chloride (K-DUR,KLOR-CON) 10 MEQ tablet Take 10 mEq by mouth daily.      Marland Kitchen DISCONTD: propranolol (INDERAL) 20 MG tablet Take 20 mg by mouth 2 (two) times daily.

## 2011-07-15 NOTE — Patient Instructions (Signed)
We will have you sign a record release for [facility] records from 5/ 23/13 No change in medications for now Return 4 months

## 2011-07-23 ENCOUNTER — Telehealth: Payer: Self-pay | Admitting: Critical Care Medicine

## 2011-07-23 NOTE — Telephone Encounter (Signed)
Called and spoke with pt and she requested that her last ov note with PW be faxed to Dr. Lynnae Prude at [phone] and the phone number is [phone] and the PA is Fransico Setters.  Pt is aware that the last ov note will be faxed and nothing else was needed.

## 2011-07-27 ENCOUNTER — Encounter: Payer: Self-pay | Admitting: Critical Care Medicine

## 2011-08-27 ENCOUNTER — Encounter: Payer: Self-pay | Admitting: Critical Care Medicine

## 2011-09-27 ENCOUNTER — Encounter: Payer: Self-pay | Admitting: Critical Care Medicine

## 2011-10-27 ENCOUNTER — Encounter: Payer: Self-pay | Admitting: Critical Care Medicine

## 2011-11-08 ENCOUNTER — Emergency Department: Payer: Self-pay | Admitting: Emergency Medicine

## 2011-11-08 LAB — URINALYSIS, COMPLETE
Bilirubin,UR: NEGATIVE
Glucose,UR: NEGATIVE mg/dL (ref 0–75)
Ketone: NEGATIVE
Ph: 6 (ref 4.5–8.0)
RBC,UR: <1 /HPF (ref 0–5)
Squamous Epithelial: NONE SEEN
WBC UR: 9 /HPF (ref 0–5)

## 2011-11-08 LAB — CBC
MCH: 32.6 pg (ref 26.0–34.0)
MCHC: 33.7 g/dL (ref 32.0–36.0)
MCV: 97 fL (ref 80–100)
RDW: 13.3 % (ref 11.5–14.5)

## 2011-11-08 LAB — COMPREHENSIVE METABOLIC PANEL
Albumin: 3.8 g/dL (ref 3.4–5.0)
Alkaline Phosphatase: 65 U/L (ref 50–136)
BUN: 28 mg/dL — ABNORMAL HIGH (ref 7–18)
Bilirubin,Total: 0.2 mg/dL (ref 0.2–1.0)
Creatinine: 0.59 mg/dL — ABNORMAL LOW (ref 0.60–1.30)
SGPT (ALT): 22 U/L (ref 12–78)
Sodium: 141 mmol/L (ref 136–145)
Total Protein: 6.7 g/dL (ref 6.4–8.2)

## 2011-11-13 ENCOUNTER — Ambulatory Visit: Payer: Self-pay | Admitting: Oncology

## 2011-11-17 ENCOUNTER — Ambulatory Visit: Payer: Self-pay | Admitting: Oncology

## 2011-11-27 ENCOUNTER — Ambulatory Visit: Payer: Self-pay | Admitting: Oncology

## 2011-11-27 ENCOUNTER — Encounter: Payer: Self-pay | Admitting: Critical Care Medicine

## 2011-12-01 ENCOUNTER — Encounter: Payer: Self-pay | Admitting: Critical Care Medicine

## 2011-12-01 ENCOUNTER — Ambulatory Visit (INDEPENDENT_AMBULATORY_CARE_PROVIDER_SITE_OTHER): Payer: Medicare Other | Admitting: Critical Care Medicine

## 2011-12-01 VITALS — BP 140/64 | HR 78 | Temp 97.9°F | Ht 62.0 in | Wt 141.6 lb

## 2011-12-01 DIAGNOSIS — J4489 Other specified chronic obstructive pulmonary disease: Secondary | ICD-10-CM

## 2011-12-01 DIAGNOSIS — J449 Chronic obstructive pulmonary disease, unspecified: Secondary | ICD-10-CM

## 2011-12-01 NOTE — Patient Instructions (Addendum)
No change in nebulizer, you can use nebulizer two additional times per day. We will call [employer] for portable oxygen concentrator Return 4 months You are clear for silver sneakers once your vertigo improves

## 2011-12-01 NOTE — Progress Notes (Signed)
Subjective:    Patient ID: [name], female    DOB: March 13, 1921, 78 y.o.   MRN: [id number]  HPI  12/01/2011 Since last OV:  Now in pulm rehab at [facility].  Did not complete d/t bladder hernia issue. Now sees urology.  Tussery not useful.  Now having difficult time with portable oxygen.  Now on liquid system 3L pulsed.  Christoper Allegra is DME.   Now no real mucus or cough.  Dyspnea seems the same.    Past Medical History  Diagnosis Date  . Seasonal allergies   . Osteoarthritis     hands, cervical spine, hips  . Benign breast lumps     and cysts  . FH: cataracts   . Depression   . Diverticulosis   . Hypertension   . Hyperthyroidism     s/p ablation  . Depression   . TIA (transient ischemic attack)   . COPD (chronic obstructive pulmonary disease)   . GERD (gastroesophageal reflux disease)   . Essential tremor   . Chronic UTI      Family History  Problem Relation Age of Onset  . Hypertension Father   . Stroke Father   . Heart disease Mother   . Allergies    . Asthma Mother     as a child  . Heart disease      grandparents  . Hypertension      grandparents  . Cancer Brother     spine (sibling)  . Emphysema Brother   . Breast cancer Daughter   . Breast cancer Daughter      History   Social History  . Marital Status: Single    Spouse Name: N/A    Number of Children: 3  . Years of Education: N/A   Occupational History  . retired    Social History Main Topics  . Smoking status: Former Smoker -- 1.0 packs/day for 65 years    Types: Cigarettes    Quit date: 11/26/2009  . Smokeless tobacco: Never Used  . Alcohol Use: Yes     Comment: occasionally wine on Sunday night  . Drug Use: Not on file  . Sexually Active: Not on file   Other Topics Concern  . Not on file   Social History Narrative  . No narrative on file     Allergies  Allergen Reactions  . Aspirin   . Bactrim     Severe stomach cramps and diarrhea  . Lactose Intolerance (Gi)   .  Levetiracetam     Fatigue, onset 06/13/10  . Penicillins   . Septra (Sulfamethoxazole W-Trimethoprim)     Nausea and diarrhea  . Sulfa Antibiotics     Severe nausea  . Tape      Outpatient Prescriptions Prior to Visit  Medication Sig Dispense Refill  . amiloride-hydrochlorothiazide (MODURETIC) 5-50 MG tablet Take 1 tablet by mouth Daily.      Marland Kitchen aspirin 81 MG tablet Take 160 mg by mouth daily.      . Calcium Carbonate (CALCIUM 500 PO) Take 1 tablet by mouth 2 (two) times daily.      . celecoxib (CELEBREX) 200 MG capsule Take 200 mg by mouth 2 (two) times daily.      . Chlorpheniramine Maleate 12 MG CP24 Take 4 mg by mouth 3 (three) times daily as needed.       Marland Kitchen estrogens, conjugated, (PREMARIN) 0.3 MG tablet Take 0.3 mg by mouth 3 (three) times a week. Marland Kitchen      Marland Kitchen  fluocinonide cream (LIDEX) 0.05 % Apply topically as needed.      Marland Kitchen ipratropium (ATROVENT) 0.03 % nasal spray Place into the nose. 1-2 times daily as needed      . ipratropium-albuterol (DUONEB) 0.5-2.5 (3) MG/3ML SOLN 4  times daily     And as needed  360 mL    . Lysine HCl 500 MG TABS Take by mouth 2 (two) times daily.      . Naftifine HCl (NAFTIN) 1 % GEL Apply topically as needed.       . nebivolol (BYSTOLIC) 10 MG tablet Take 1 tablet (10 mg total) by mouth daily.  30 tablet  6  . omeprazole (PRILOSEC) 20 MG capsule Take 20 mg by mouth 2 (two) times daily.       . potassium chloride (MICRO-K) 10 MEQ CR capsule Take 1 tablet by mouth.      . traMADol (ULTRAM) 50 MG tablet Take 50 mg by mouth every 6 (six) hours as needed.      . valsartan (DIOVAN) 160 MG tablet Take 160 mg by mouth 2 (two) times daily.      Marland Kitchen Lysine 500 MG TABS Take by mouth 2 (two) times daily.          Review of Systems  Constitutional: Positive for fatigue.  Gastrointestinal:       No n/v  No heartburn  Genitourinary: Positive for difficulty urinating.       Objective:   Physical Exam  Filed Vitals:   12/01/11 1217  BP: 140/64  Pulse: 78    Temp: 97.9 F (36.6 C)  TempSrc: Oral  Height: 5\' 2"  (1.575 m)  Weight: 141 lb 9.6 oz (64.229 kg)  SpO2: 94%    Gen: Pleasant, well-nourished, in no distress,  normal affect  ENT: No lesions,  mouth clear,  oropharynx clear, no postnasal drip  Neck: No JVD, no TMG, no carotid bruits  Lungs: No use of accessory muscles, no dullness to percussion, distant BS  Cardiovascular: RRR, heart sounds normal, no murmur or gallops, no peripheral edema  Abdomen: soft and NT, no HSM,  BS normal  Musculoskeletal: No deformities, no cyanosis or clubbing  Neuro: alert, non focal  Skin: Warm, no lesions or rashes         Assessment & Plan:   COPD (chronic obstructive pulmonary disease) COPD gold stage D. with oxygen dependency Plan Attempt obtain portable oxygen concentrator Maintain nebulized therapy as prescribed The patient is cleared for maintenance pulmonary rehabilitation in the silver  sneakers program    Updated Medication List Outpatient Encounter Prescriptions as of 12/01/2011  Medication Sig Dispense Refill  . amiloride-hydrochlorothiazide (MODURETIC) 5-50 MG tablet Take 1 tablet by mouth Daily.      Marland Kitchen aspirin 81 MG tablet Take 160 mg by mouth daily.      . Calcium Carbonate (CALCIUM 500 PO) Take 1 tablet by mouth 2 (two) times daily.      . celecoxib (CELEBREX) 200 MG capsule Take 200 mg by mouth 2 (two) times daily.      . Chlorpheniramine Maleate 12 MG CP24 Take 4 mg by mouth 3 (three) times daily as needed.       Marland Kitchen estrogens, conjugated, (PREMARIN) 0.3 MG tablet Take 0.3 mg by mouth 3 (three) times a week. .      . fluocinonide cream (LIDEX) 0.05 % Apply topically as needed.      . hydrocortisone cream 0.5 % Apply topically as needed.      Marland Kitchen  ipratropium (ATROVENT) 0.03 % nasal spray Place into the nose. 1-2 times daily as needed      . ipratropium-albuterol (DUONEB) 0.5-2.5 (3) MG/3ML SOLN 4  times daily     And as needed  360 mL    . Lysine HCl 500 MG TABS Take  by mouth 2 (two) times daily.      . meclizine (ANTIVERT) 25 MG tablet Take 0.5-1 tablets by mouth Three times daily as needed.      . Naftifine HCl (NAFTIN) 1 % GEL Apply topically as needed.       . nebivolol (BYSTOLIC) 10 MG tablet Take 1 tablet (10 mg total) by mouth daily.  30 tablet  6  . omeprazole (PRILOSEC) 20 MG capsule Take 20 mg by mouth 2 (two) times daily.       . potassium chloride (MICRO-K) 10 MEQ CR capsule Take 1 tablet by mouth.      . traMADol (ULTRAM) 50 MG tablet Take 50 mg by mouth every 6 (six) hours as needed.      . valsartan (DIOVAN) 160 MG tablet Take 160 mg by mouth 2 (two) times daily.      . [DISCONTINUED] Lysine 500 MG TABS Take by mouth 2 (two) times daily.

## 2011-12-01 NOTE — Assessment & Plan Note (Signed)
COPD gold stage D. with oxygen dependency Plan Attempt obtain portable oxygen concentrator Maintain nebulized therapy as prescribed The patient is cleared for maintenance pulmonary rehabilitation in the silver  sneakers program

## 2012-04-25 ENCOUNTER — Telehealth: Payer: Self-pay | Admitting: Critical Care Medicine

## 2012-04-25 NOTE — Telephone Encounter (Signed)
Pt called back. Says she will take the appt on 06-14-12 and will call if she needs anything further. [name]

## 2012-04-27 ENCOUNTER — Ambulatory Visit: Payer: Medicare Other | Admitting: Critical Care Medicine

## 2012-06-14 ENCOUNTER — Ambulatory Visit (INDEPENDENT_AMBULATORY_CARE_PROVIDER_SITE_OTHER): Payer: Medicare Other | Admitting: Critical Care Medicine

## 2012-06-14 ENCOUNTER — Encounter: Payer: Self-pay | Admitting: Critical Care Medicine

## 2012-06-14 VITALS — BP 130/60 | HR 79 | Temp 97.4°F | Ht 64.0 in | Wt 148.8 lb

## 2012-06-14 DIAGNOSIS — J449 Chronic obstructive pulmonary disease, unspecified: Secondary | ICD-10-CM

## 2012-06-14 DIAGNOSIS — J9610 Chronic respiratory failure, unspecified whether with hypoxia or hypercapnia: Secondary | ICD-10-CM

## 2012-06-14 NOTE — Assessment & Plan Note (Signed)
Gold stage D. COPD  With chronic respiratory failure stable at this time Plan Maintain inhaled medications as prescribed Maintain oxygen therapy The patient desires to be followed in the [facility] clinic and  will be referred to that clinic

## 2012-06-14 NOTE — Progress Notes (Signed)
Subjective:    Patient ID: [name], female    DOB: [birth year], 79 y.o.   MRN: [id number]  HPI  06/14/2012 Since last OV, pt has good and bad days.  Since last ov, bladder control issues, depends, no skin breakdown issues. Not using a foley.  Now more anemia.  Pt lives independently in home.   Past Medical History  Diagnosis Date  . Seasonal allergies   . Osteoarthritis     hands, cervical spine, hips  . Benign breast lumps     and cysts  . FH: cataracts   . Depression   . Diverticulosis   . Hypertension   . Hyperthyroidism     s/p ablation  . Depression   . TIA (transient ischemic attack)   . COPD (chronic obstructive pulmonary disease)   . GERD (gastroesophageal reflux disease)   . Essential tremor   . Chronic UTI      Family History  Problem Relation Age of Onset  . Hypertension Father   . Stroke Father   . Heart disease Mother   . Allergies    . Asthma Mother     as a child  . Heart disease      grandparents  . Hypertension      grandparents  . Cancer Brother     spine (sibling)  . Emphysema Brother   . Breast cancer Daughter   . Breast cancer Daughter      History   Social History  . Marital Status: Single    Spouse Name: N/A    Number of Children: 3  . Years of Education: N/A   Occupational History  . retired    Social History Main Topics  . Smoking status: Former Smoker -- 1.00 packs/day for 65 years    Types: Cigarettes    Quit date: 11/26/2009  . Smokeless tobacco: Never Used  . Alcohol Use: Yes     Comment: occasionally wine on Sunday night  . Drug Use: Not on file  . Sexually Active: Not on file   Other Topics Concern  . Not on file   Social History Narrative  . No narrative on file     Allergies  Allergen Reactions  . Aspirin   . Bactrim     Severe stomach cramps and diarrhea  . Lactose Intolerance (Gi)   . Levetiracetam     Fatigue, onset 06/13/10  . Penicillins   . Septra (Sulfamethoxazole W-Trimethoprim)     Nausea and diarrhea  . Sulfa Antibiotics     Severe nausea  . Tape      Outpatient Prescriptions Prior to Visit  Medication Sig Dispense Refill  . amiloride-hydrochlorothiazide (MODURETIC) 5-50 MG tablet Take 1 tablet by mouth Daily.      Marland Kitchen aspirin 81 MG tablet Take 81 mg by mouth daily.       . Calcium Carbonate (CALCIUM 500 PO) Take 1 tablet by mouth 2 (two) times daily.      . celecoxib (CELEBREX) 200 MG capsule Take 200 mg by mouth 2 (two) times daily.      Marland Kitchen estrogens, conjugated, (PREMARIN) 0.3 MG tablet Take 0.3 mg by mouth 3 (three) times a week. .      . fluocinonide cream (LIDEX) 0.05 % Apply topically as needed.      . hydrocortisone cream 0.5 % Apply topically as needed.      Marland Kitchen ipratropium (ATROVENT) 0.03 % nasal spray Place 2 sprays into the nose 2 (two) times daily.       Marland Kitchen  ipratropium-albuterol (DUONEB) 0.5-2.5 (3) MG/3ML SOLN 4  times daily     And as needed  360 mL    . Lysine HCl 500 MG TABS Take by mouth 2 (two) times daily.      . meclizine (ANTIVERT) 25 MG tablet Take 0.5-1 tablets by mouth Three times daily as needed.      . Naftifine HCl (NAFTIN) 1 % GEL Apply topically as needed.       . nebivolol (BYSTOLIC) 10 MG tablet Take 1 tablet (10 mg total) by mouth daily.  30 tablet  6  . omeprazole (PRILOSEC) 20 MG capsule Take 20 mg by mouth 2 (two) times daily.       . potassium chloride (MICRO-K) 10 MEQ CR capsule Take 1 tablet by mouth.      . traMADol (ULTRAM) 50 MG tablet Take 50 mg by mouth every 6 (six) hours as needed.      . valsartan (DIOVAN) 160 MG tablet Take 160 mg by mouth 2 (two) times daily.      . Chlorpheniramine Maleate 12 MG CP24 Take 4 mg by mouth 3 (three) times daily as needed.        No facility-administered medications prior to visit.      Review of Systems  Constitutional: Positive for fatigue.  Gastrointestinal:       No n/v  No heartburn  Genitourinary: Positive for difficulty urinating.       Objective:   Physical Exam  Filed  Vitals:   06/14/12 1214  BP: 130/60  Pulse: 79  Temp: 97.4 F (36.3 C)  TempSrc: Oral  Height: 5\' 4"  (1.626 m)  Weight: 148 lb 12.8 oz (67.495 kg)  SpO2: 95%    Gen: Pleasant, well-nourished, in no distress,  normal affect  ENT: No lesions,  mouth clear,  oropharynx clear, no postnasal drip  Neck: No JVD, no TMG, no carotid bruits  Lungs: No use of accessory muscles, no dullness to percussion, distant BS  Cardiovascular: RRR, heart sounds normal, no murmur or gallops, no peripheral edema  Abdomen: soft and NT, no HSM,  BS normal  Musculoskeletal: No deformities, no cyanosis or clubbing  Neuro: alert, non focal  Skin: Warm, no lesions or rashes     Assessment & Plan:   COPD (chronic obstructive pulmonary disease)Gold D Gold stage D. COPD  With chronic respiratory failure stable at this time Plan Maintain inhaled medications as prescribed Maintain oxygen therapy The patient desires to be followed in the [facility] clinic and  will be referred to that clinic    Updated Medication List Outpatient Encounter Prescriptions as of 06/14/2012  Medication Sig Dispense Refill  . amiloride-hydrochlorothiazide (MODURETIC) 5-50 MG tablet Take 1 tablet by mouth Daily.      Marland Kitchen aspirin 81 MG tablet Take 81 mg by mouth daily.       . Calcium Carbonate (CALCIUM 500 PO) Take 1 tablet by mouth 2 (two) times daily.      . celecoxib (CELEBREX) 200 MG capsule Take 200 mg by mouth 2 (two) times daily.      . clonazePAM (KLONOPIN) 1 MG tablet Take 1 tablet by mouth at bedtime as needed.      Marland Kitchen estrogens, conjugated, (PREMARIN) 0.3 MG tablet Take 0.3 mg by mouth 3 (three) times a week. .      . fluocinonide cream (LIDEX) 0.05 % Apply topically as needed.      . hydrocortisone cream 0.5 % Apply topically as needed.      Marland Kitchen  ipratropium (ATROVENT) 0.03 % nasal spray Place 2 sprays into the nose 2 (two) times daily.       Marland Kitchen ipratropium-albuterol (DUONEB) 0.5-2.5 (3) MG/3ML SOLN 4  times daily      And as needed  360 mL    . Lysine HCl 500 MG TABS Take by mouth 2 (two) times daily.      . meclizine (ANTIVERT) 25 MG tablet Take 0.5-1 tablets by mouth Three times daily as needed.      . Naftifine HCl (NAFTIN) 1 % GEL Apply topically as needed.       . nebivolol (BYSTOLIC) 10 MG tablet Take 1 tablet (10 mg total) by mouth daily.  30 tablet  6  . ofloxacin (FLOXIN) 0.3 % otic solution Place 4 drops into the left ear 2 (two) times daily.      Marland Kitchen omeprazole (PRILOSEC) 20 MG capsule Take 20 mg by mouth 2 (two) times daily.       . potassium chloride (MICRO-K) 10 MEQ CR capsule Take 1 tablet by mouth.      . traMADol (ULTRAM) 50 MG tablet Take 50 mg by mouth every 6 (six) hours as needed.      . trimethoprim (TRIMPEX) 100 MG tablet Take 1 tablet by mouth every morning. To help prevent UTI      . valsartan (DIOVAN) 160 MG tablet Take 160 mg by mouth 2 (two) times daily.      . [DISCONTINUED] Chlorpheniramine Maleate 12 MG CP24 Take 4 mg by mouth 3 (three) times daily as needed.        No facility-administered encounter medications on file as of 06/14/2012.

## 2012-06-14 NOTE — Patient Instructions (Addendum)
No change in medications. Return in      2 months at [facility] with [place].

## 2012-06-28 ENCOUNTER — Other Ambulatory Visit: Payer: Self-pay | Admitting: Unknown Physician Specialty

## 2012-07-15 ENCOUNTER — Ambulatory Visit: Payer: Self-pay | Admitting: Oncology

## 2012-08-16 ENCOUNTER — Institutional Professional Consult (permissible substitution): Payer: Medicare Other | Admitting: Pulmonary Disease

## 2012-08-23 ENCOUNTER — Encounter: Payer: Self-pay | Admitting: Pulmonary Disease

## 2012-08-23 ENCOUNTER — Ambulatory Visit (INDEPENDENT_AMBULATORY_CARE_PROVIDER_SITE_OTHER): Payer: Medicare Other | Admitting: Pulmonary Disease

## 2012-08-23 VITALS — BP 136/64 | HR 85 | Temp 97.8°F | Ht 63.0 in | Wt 145.8 lb

## 2012-08-23 DIAGNOSIS — J9610 Chronic respiratory failure, unspecified whether with hypoxia or hypercapnia: Secondary | ICD-10-CM

## 2012-08-23 DIAGNOSIS — J449 Chronic obstructive pulmonary disease, unspecified: Secondary | ICD-10-CM

## 2012-08-23 MED ORDER — AEROCHAMBER PLUS MISC: Status: AC

## 2012-08-23 MED ORDER — MOMETASONE FURO-FORMOTEROL FUM 100-5 MCG/ACT IN AERO: 2.0000 | INHALATION_SPRAY | Freq: Two times a day (BID) | RESPIRATORY_TRACT | Status: DC

## 2012-08-23 NOTE — Assessment & Plan Note (Signed)
Continue 2 L at rest, 3 L with exertion

## 2012-08-23 NOTE — Patient Instructions (Signed)
Start using the [facility] 2 puffs twice a day with a spacer Try to cut back on the duoneb treatments to twice a day We will refer you back to pulmonary rehab

## 2012-08-23 NOTE — Progress Notes (Signed)
Subjective:    Patient ID: [name], female    DOB: Feb 12, 1921, 79 y.o.   MRN: [id number]   Synopsis: [name] has gold grade D COPD. 8 2013 simple spirometry showed an FEV1 of 650 cc, 40% predicted with clear airflow obstruction. She uses 2 L of oxygen at rest and 3 L with exertion. She has previously completed lung works at [facility].  HPI  08/23/2012 routine office visit>> This is a patient of Dr. Delford Field 2 is now transferring her care to [place] because she was in [place]. She has gold grade D. COPD and uses 2 L of oxygen at rest and 3 L with exertion. She smoked one pack of cigarettes daily for nearly 70 years and quit in 2011. In general, she thinks things are going okay but she does feel that she is getting weaker. She states that she feels that overall fatigue as well as progression in her dyspnea with any activity. She has not been active at all with any exercise and is interested in starting pulmonary rehabilitation again. She is also frustrated by the time it takes for her to use her duo neb 4 times a day. She does feel a clear benefit from using it. She notes that she has gained 20-30 pounds since quitting smoking 3 years ago. She continues to use and benefit from her oxygen.   Past Medical History  Diagnosis Date  . Seasonal allergies   . Osteoarthritis     hands, cervical spine, hips  . Benign breast lumps     and cysts  . FH: cataracts   . Depression   . Diverticulosis   . Hypertension   . Hyperthyroidism     s/p ablation  . Depression   . TIA (transient ischemic attack)   . COPD (chronic obstructive pulmonary disease)   . GERD (gastroesophageal reflux disease)   . Essential tremor   . Chronic UTI      Review of Systems  Constitutional: Positive for fatigue. Negative for fever and chills.  HENT: Negative for nosebleeds, congestion and rhinorrhea.   Respiratory: Positive for cough, shortness of breath and wheezing.    Cardiovascular: Negative for chest pain, palpitations and leg swelling.       Objective:   Physical Exam Filed Vitals:   08/23/12 1143  BP: 136/64  Pulse: 85  Temp: 97.8 F (36.6 C)  TempSrc: Oral  Height: 5\' 3"  (1.6 m)  Weight: 145 lb 12.8 oz (66.134 kg)  SpO2: 93%   Gen: well appearing, no acute distress HEENT: NCAT, PERRL, EOMi, OP clear, neck supple without masses PULM: poor air movement, wheezing in base CV: RRR, slight systolic murmur, no JVD AB: BS+, soft, nontender, no hsm Ext: warm, no edema, no clubbing, no cyanosis Derm: no rash or skin breakdown Neuro: A&Ox4, MAEW        Assessment & Plan:   COPD (chronic obstructive pulmonary disease)Gold D [name] has been experiencing more fatigue and shortness of breath lately. This is very likely due to deconditioning and progression of her end-stage COPD. I explained to her and her daughter that basically her only options at this point in terms of aggressive therapy are to add bronchodilators and inhaled corticosteroids encourage pulmonary rehabilitation. Is not clear to me whether or not she'll be a little tolerate this. If not, then we may have to discuss hospice.  Plan: -Pulmonary rehabilitation referral -Start Dulera twice a day -Try to decrease the dose of doing that after starting  Dulera   Chronic respiratory failure Continue 2 L at rest, 3 L with exertion    Updated Medication List Outpatient Encounter Prescriptions as of 08/23/2012  Medication Sig Dispense Refill  . amiloride-hydrochlorothiazide (MODURETIC) 5-50 MG tablet Take 1 tablet by mouth Daily.      Marland Kitchen aspirin 81 MG tablet Take 81 mg by mouth daily.       . Calcium Carbonate (CALCIUM 500 PO) Take 1 tablet by mouth 2 (two) times daily.      . celecoxib (CELEBREX) 200 MG capsule Take 200 mg by mouth 2 (two) times daily.      . chlorpheniramine (CHLOR-TRIMETON) 4 MG tablet Take 4 mg by mouth 3 (three) times daily.      . clonazePAM (KLONOPIN) 1 MG  tablet Take 0.5 mg by mouth at bedtime as needed.       Marland Kitchen estrogens, conjugated, (PREMARIN) 0.3 MG tablet Take 0.3 mg by mouth 3 (three) times a week. .      . fluocinonide cream (LIDEX) 0.05 % Apply topically as needed.      . hydrocortisone cream 0.5 % Apply topically as needed.      Marland Kitchen ipratropium (ATROVENT) 0.03 % nasal spray Place 2 sprays into the nose 2 (two) times daily.       Marland Kitchen ipratropium-albuterol (DUONEB) 0.5-2.5 (3) MG/3ML SOLN 4  times daily     And as needed  360 mL    . Lysine HCl 500 MG TABS Take by mouth 2 (two) times daily.      . meclizine (ANTIVERT) 25 MG tablet Take 0.5-1 tablets by mouth Three times daily as needed.      . Naftifine HCl (NAFTIN) 1 % GEL Apply topically as needed.       . nebivolol (BYSTOLIC) 10 MG tablet Take 1 tablet (10 mg total) by mouth daily.  30 tablet  6  . omeprazole (PRILOSEC) 20 MG capsule Take 20 mg by mouth 2 (two) times daily.       . potassium chloride (MICRO-K) 10 MEQ CR capsule Take 1 tablet by mouth.      . traMADol (ULTRAM) 50 MG tablet Take 50 mg by mouth every 6 (six) hours as needed.      . trimethoprim (TRIMPEX) 100 MG tablet Take 1 tablet by mouth every morning. To help prevent UTI      . valsartan (DIOVAN) 160 MG tablet Take 160 mg by mouth 2 (two) times daily.      . [DISCONTINUED] ofloxacin (FLOXIN) 0.3 % otic solution Place 4 drops into the left ear 2 (two) times daily.       No facility-administered encounter medications on file as of 08/23/2012.

## 2012-08-23 NOTE — Assessment & Plan Note (Signed)
Takiah has been experiencing more fatigue and shortness of breath lately. This is very likely due to deconditioning and progression of her end-stage COPD. I explained to her and her daughter that basically her only options at this point in terms of aggressive therapy are to add bronchodilators and inhaled corticosteroids encourage pulmonary rehabilitation. Is not clear to me whether or not she'll be a little tolerate this. If not, then we may have to discuss hospice.  Plan: -Pulmonary rehabilitation referral -Start Dulera twice a day -Try to decrease the dose of doing that after starting [facility]

## 2012-08-25 ENCOUNTER — Telehealth: Payer: Self-pay | Admitting: Pulmonary Disease

## 2012-08-25 NOTE — Telephone Encounter (Signed)
I spoke with pt. Made her aware on how to use the inhaler and then the inhaler w/ spacer. She voiced her understanding and needed nothing further

## 2012-09-28 ENCOUNTER — Ambulatory Visit: Payer: Self-pay | Admitting: General Surgery

## 2012-10-04 ENCOUNTER — Ambulatory Visit (INDEPENDENT_AMBULATORY_CARE_PROVIDER_SITE_OTHER): Payer: Medicare Other | Admitting: Pulmonary Disease

## 2012-10-04 ENCOUNTER — Encounter: Payer: Self-pay | Admitting: Pulmonary Disease

## 2012-10-04 ENCOUNTER — Telehealth: Payer: Self-pay | Admitting: Pulmonary Disease

## 2012-10-04 VITALS — BP 124/76 | HR 76 | Temp 97.6°F | Ht 63.5 in | Wt 144.0 lb

## 2012-10-04 DIAGNOSIS — J9610 Chronic respiratory failure, unspecified whether with hypoxia or hypercapnia: Secondary | ICD-10-CM

## 2012-10-04 DIAGNOSIS — J449 Chronic obstructive pulmonary disease, unspecified: Secondary | ICD-10-CM

## 2012-10-04 DIAGNOSIS — M549 Dorsalgia, unspecified: Secondary | ICD-10-CM

## 2012-10-04 NOTE — Progress Notes (Signed)
Subjective:    Patient ID: [name], female    DOB: [birth year], 79 y.o.   MRN: [id number]   Synopsis: [name] has gold grade D COPD. 8 2013 simple spirometry showed an FEV1 of 650 cc, 40% predicted with clear airflow obstruction. She uses 2 L of oxygen at rest and 3 L with exertion. She has previously completed lung works at [facility].  HPI   08/23/2012 routine office visit>> This is a patient of Dr. Delford Field 2 is now transferring her care to [place] because she was in [place]. She has gold grade D. COPD and uses 2 L of oxygen at rest and 3 L with exertion. She smoked one pack of cigarettes daily for nearly 70 years and quit in 2011. In general, she thinks things are going okay but she does feel that she is getting weaker. She states that she feels that overall fatigue as well as progression in her dyspnea with any activity. She has not been active at all with any exercise and is interested in starting pulmonary rehabilitation again. She is also frustrated by the time it takes for her to use her duo neb 4 times a day. She does feel a clear benefit from using it. She notes that she has gained 20-30 pounds since quitting smoking 3 years ago. She continues to use and benefit from her oxygen.  10/04/2012 ROV> [name] feels like Elwin Sleight has made a tremendous difference in her breathing.  She feels some soreness in her upper back, in the middle ever since her recent mamogram.  It comes and goes, and is not a consistent pain. It is not a sharp pain, but feels like something is interfering with getting a deep breath. She has not had a recent falls.  She coughs sometimes when holding her breath after the dulera.  She feels that the dulera has made her more independent.  Energy level is better since last visit.  Past Medical History  Diagnosis Date  . Seasonal allergies   . Osteoarthritis     hands, cervical spine, hips  . Benign breast lumps     and cysts  . FH:  cataracts   . Depression   . Diverticulosis   . Hypertension   . Hyperthyroidism     s/p ablation  . Depression   . TIA (transient ischemic attack)   . COPD (chronic obstructive pulmonary disease)   . GERD (gastroesophageal reflux disease)   . Essential tremor   . Chronic UTI      Review of Systems  Constitutional: Positive for fatigue. Negative for fever and chills.  HENT: Negative for nosebleeds, congestion and rhinorrhea.   Respiratory: Positive for cough, shortness of breath and wheezing.   Cardiovascular: Negative for chest pain, palpitations and leg swelling.       Objective:   Physical Exam  Filed Vitals:   10/04/12 1136  BP: 124/76  Pulse: 76  Temp: 97.6 F (36.4 C)  TempSrc: Oral  Height: 5' 3.5" (1.613 m)  Weight: 144 lb (65.318 kg)  SpO2: 96%   Gen: chronically ill appearing, no acute distress HEENT: NCAT, EOMi,  PULM: air movement somewhat improved, one area of wheeze left base, some crackles upper lobes CV: RRR, slight systolic murmur, no JVD AB:  soft, nontender, no hsm Ext: warm, no edema, no clubbing, no cyanosis MSK: point tenderness (mild) over thoracic      Assessment & Plan:   COPD (chronic obstructive pulmonary disease)Gold D [name] has  done very well with the [facility]. She now feels more independent since starting it. She needs a flu shot today. She should restart pulmonary rehabilitation as soon as possible.  In general, selective b-blockers cause little respiratory side effects in non-asthmatics.  It is not clear to me that she was having trouble on propanolol in the past from a respiratory standpoint. Because she says that propanolol helped her with her tremor significantly, I have no problem with her going back on this.  Plan: -Flu shot -Continue Dulera -Pulmonary rehabilitation -Okay by me to switch from nebivolol to propanolol, will check with Dr. Gwen Pounds  Chronic respiratory failure Continue 2 L at rest and 3L with  exertion  Back pain I agree with her daughter that her back pain is likely a muscle strain related to her recent mammogram. If it does not improve in the next one week then she should have a chest x-ray to look for compression fracture.    Updated Medication List Outpatient Encounter Prescriptions as of 10/04/2012  Medication Sig Dispense Refill  . amiloride-hydrochlorothiazide (MODURETIC) 5-50 MG tablet Take 1 tablet by mouth Daily.      Marland Kitchen aspirin 81 MG tablet Take 81 mg by mouth daily.       . Calcium Carbonate (CALCIUM 500 PO) Take 1 tablet by mouth 2 (two) times daily.      . celecoxib (CELEBREX) 200 MG capsule Take 200 mg by mouth 2 (two) times daily.      . chlorpheniramine (CHLOR-TRIMETON) 4 MG tablet Take 4 mg by mouth 3 (three) times daily.      . clonazePAM (KLONOPIN) 1 MG tablet Take 0.5 mg by mouth at bedtime as needed.       Marland Kitchen estrogens, conjugated, (PREMARIN) 0.3 MG tablet Take 0.3 mg by mouth 3 (three) times a week. .      . fluocinonide cream (LIDEX) 0.05 % Apply topically as needed.      . hydrocortisone cream 0.5 % Apply topically as needed.      Marland Kitchen ipratropium (ATROVENT) 0.03 % nasal spray Place 2 sprays into the nose 2 (two) times daily.       Marland Kitchen ipratropium-albuterol (DUONEB) 0.5-2.5 (3) MG/3ML SOLN 4  times daily     And as needed  360 mL    . losartan (COZAAR) 50 MG tablet Take 1 tablet by mouth daily.      Marland Kitchen Lysine HCl 500 MG TABS Take by mouth 2 (two) times daily.      . meclizine (ANTIVERT) 25 MG tablet Take 0.5-1 tablets by mouth Three times daily as needed.      . mometasone-formoterol (DULERA) 100-5 MCG/ACT AERO Inhale 2 puffs into the lungs 2 (two) times daily.  1 Inhaler  2  . Naftifine HCl (NAFTIN) 1 % GEL Apply topically as needed.       . nebivolol (BYSTOLIC) 10 MG tablet Take 1 tablet (10 mg total) by mouth daily.  30 tablet  6  . omeprazole (PRILOSEC) 20 MG capsule Take 20 mg by mouth 2 (two) times daily.       . potassium chloride (MICRO-K) 10 MEQ CR  capsule Take 1 tablet by mouth.      . Spacer/Aero-Holding Chambers (AEROCHAMBER PLUS) inhaler Use as instructed  1 each  0  . traMADol (ULTRAM) 50 MG tablet Take 50 mg by mouth every 6 (six) hours as needed.      . trimethoprim (TRIMPEX) 100 MG tablet Take 1 tablet by mouth every  morning. To help prevent UTI      . valsartan (DIOVAN) 160 MG tablet Take 160 mg by mouth 2 (two) times daily.       No facility-administered encounter medications on file as of 10/04/2012.

## 2012-10-04 NOTE — Assessment & Plan Note (Signed)
I agree with her daughter that her back pain is likely a muscle strain related to her recent mammogram. If it does not improve in the next one week then she should have a chest x-ray to look for compression fracture.

## 2012-10-04 NOTE — Telephone Encounter (Signed)
ATC Dondra Spry w Dr. Philemon Kingdom office-- Office is closed [facility]  Pt seen today: Patient Instructions    Keep taking the [facility] with a spacer  Keep using your oxygen as you are doing  We will contact Dr. Myles Rosenthal office about the propanolol  If he is OK with it, then we will send in a prescription for the propanolol  If you find that your breathing is worse on the propanolol, then let us know  We will see you back in 3-4 months or sooner if needed

## 2012-10-04 NOTE — Patient Instructions (Signed)
Keep taking the [facility] with a spacer Keep using your oxygen as you are doing We will contact Dr. Myles Rosenthal office about the propanolol If he is OK with it, then we will send in a prescription for the propanolol If you find that your breathing is worse on the propanolol, then let us know  We will see you back in 3-4 months or sooner if needed

## 2012-10-04 NOTE — Assessment & Plan Note (Signed)
Continue 2 L at rest and 3 L with exertion 

## 2012-10-04 NOTE — Assessment & Plan Note (Signed)
[name] has done very well with the [facility]. She now feels more independent since starting it. She needs a flu shot today. She should restart pulmonary rehabilitation as soon as possible.  In general, selective b-blockers cause little respiratory side effects in non-asthmatics.  It is not clear to me that she was having trouble on propanolol in the past from a respiratory standpoint. Because she says that propanolol helped her with her tremor significantly, I have no problem with her going back on this.  Plan: -Flu shot -Continue Dulera -Pulmonary rehabilitation -Okay by me to switch from nebivolol to propanolol, will check with Dr. Gwen Pounds

## 2012-10-05 NOTE — Telephone Encounter (Signed)
Spoke with Dr Philemon Kingdom nurse.  He is ok for pt to go back on Propranolol.  Please advise on dose to rx.

## 2012-10-06 ENCOUNTER — Telehealth: Payer: Self-pay | Admitting: Pulmonary Disease

## 2012-10-06 NOTE — Telephone Encounter (Signed)
Sure, propanolol 20mg  po bid Stop nebivolol

## 2012-10-06 NOTE — Telephone Encounter (Signed)
See new phone note

## 2012-10-06 NOTE — Telephone Encounter (Signed)
Spoke with the pt She states needs rx for propranolol and wants to know if we can go ahead and send this Per last phone note, this is okay with Dr Kirtland Bouchard Please advise on strength and directions for use, thanks!

## 2012-10-07 MED ORDER — PROPRANOLOL HCL 20 MG PO TABS: 20.0000 mg | ORAL_TABLET | Freq: Two times a day (BID) | ORAL | Status: DC

## 2012-10-07 MED ORDER — PROPRANOLOL HCL 20 MG PO TABS: 20.0000 mg | ORAL_TABLET | Freq: Three times a day (TID) | ORAL | Status: DC

## 2012-10-07 NOTE — Telephone Encounter (Signed)
Rx was sent to pharm  Pt aware MAR updated

## 2012-10-13 ENCOUNTER — Telehealth: Payer: Self-pay | Admitting: Pulmonary Disease

## 2012-10-13 NOTE — Telephone Encounter (Signed)
Patient states that she feels she is having some symptoms with her Dulera.  Pt c/o increased cough and severe leg pain since starting Dulera.  Patient states that she read the insert to the medication and those are some of the side effects listed.  Pt currently using 2 puff BID and is wanting to decrease dose if okay.  Patient aware that BQ not in office. Message will be sent to Dr Maple Hudson for recommendations.   Dr Maple Hudson please advise. Thanks!

## 2012-10-13 NOTE — Telephone Encounter (Signed)
Ssuggest she try reducing the [facility] she has to 1 puff and rinse, twice daily and report to Dr Kendrick Fries

## 2012-10-13 NOTE — Telephone Encounter (Signed)
Pt aware of recs Per CY. Patient going to call BQ next week to report her progress.  Will send to BQ as FYI of changes in medication dosing.

## 2012-10-20 ENCOUNTER — Ambulatory Visit: Payer: Self-pay | Admitting: Oncology

## 2012-10-20 LAB — CBC CANCER CENTER
Basophil #: 0 x10 3/mm (ref 0.0–0.1)
Basophil %: 0.4 %
Eosinophil %: 0.4 %
HCT: 34.2 % — ABNORMAL LOW (ref 35.0–47.0)
Lymphocyte #: 1.3 x10 3/mm (ref 1.0–3.6)
Lymphocyte %: 21.6 %
MCHC: 34 g/dL (ref 32.0–36.0)
Monocyte #: 0.7 x10 3/mm (ref 0.2–0.9)
Monocyte %: 12.2 %
Neutrophil #: 3.8 x10 3/mm (ref 1.4–6.5)
Neutrophil %: 65.4 %
Platelet: 173 x10 3/mm (ref 150–440)
RBC: 3.56 10*6/uL — ABNORMAL LOW (ref 3.80–5.20)
RDW: 14.3 % (ref 11.5–14.5)
WBC: 5.8 x10 3/mm (ref 3.6–11.0)

## 2012-10-20 LAB — LACTATE DEHYDROGENASE: LDH: 262 U/L — ABNORMAL HIGH (ref 81–246)

## 2012-10-20 LAB — FOLATE: Folic Acid: 27.7 ng/mL (ref 3.1–100.0)

## 2012-10-20 LAB — IRON AND TIBC: Iron Saturation: 18 %

## 2012-10-26 ENCOUNTER — Ambulatory Visit: Payer: Self-pay | Admitting: Oncology

## 2012-11-01 ENCOUNTER — Encounter: Payer: Self-pay | Admitting: Pulmonary Disease

## 2012-11-08 ENCOUNTER — Encounter: Payer: Self-pay | Admitting: Pulmonary Disease

## 2012-11-16 ENCOUNTER — Ambulatory Visit (INDEPENDENT_AMBULATORY_CARE_PROVIDER_SITE_OTHER): Payer: Medicare Other | Admitting: Podiatry

## 2012-11-16 ENCOUNTER — Encounter: Payer: Self-pay | Admitting: Podiatry

## 2012-11-16 VITALS — BP 117/66 | HR 85 | Resp 16 | Ht 63.0 in | Wt 145.0 lb

## 2012-11-16 DIAGNOSIS — M79609 Pain in unspecified limb: Secondary | ICD-10-CM

## 2012-11-16 DIAGNOSIS — B351 Tinea unguium: Secondary | ICD-10-CM

## 2012-11-16 NOTE — Progress Notes (Signed)
[name] presents today with a chief complaint of painful toenails bilateral.  Objective: Pulses are palpable bilateral. Nails are thick yellow dystrophic clinically mycotic and painful palpation.  Assessment: Pain in limb secondary to onychomycosis bilateral.  Plan: Debridement of nails in thickness and length as a covered service secondary to pain. Followup with her in 3 months.

## 2012-11-21 ENCOUNTER — Telehealth: Payer: Self-pay | Admitting: *Deleted

## 2012-11-21 NOTE — Telephone Encounter (Signed)
Patient looked over recent visit avs and was concerned about the list of medications that are listed

## 2012-11-26 ENCOUNTER — Encounter: Payer: Self-pay | Admitting: Pulmonary Disease

## 2012-12-08 ENCOUNTER — Other Ambulatory Visit: Payer: Self-pay | Admitting: *Deleted

## 2012-12-08 MED ORDER — MOMETASONE FURO-FORMOTEROL FUM 100-5 MCG/ACT IN AERO: 2.0000 | INHALATION_SPRAY | Freq: Two times a day (BID) | RESPIRATORY_TRACT | Status: DC

## 2013-01-02 ENCOUNTER — Encounter: Payer: Self-pay | Admitting: Pulmonary Disease

## 2013-01-23 ENCOUNTER — Ambulatory Visit: Payer: Self-pay | Admitting: Oncology

## 2013-01-23 LAB — CBC CANCER CENTER
Basophil %: 0.2 %
HCT: 34.5 % — ABNORMAL LOW (ref 35.0–47.0)
Lymphocyte #: 1.4 x10 3/mm (ref 1.0–3.6)
Lymphocyte %: 21.5 %
MCH: 31.9 pg (ref 26.0–34.0)
MCHC: 32.5 g/dL (ref 32.0–36.0)
MCV: 98 fL (ref 80–100)
Monocyte #: 0.8 x10 3/mm (ref 0.2–0.9)
Neutrophil %: 65 %
RBC: 3.52 10*6/uL — ABNORMAL LOW (ref 3.80–5.20)
RDW: 13.3 % (ref 11.5–14.5)
WBC: 6.7 x10 3/mm (ref 3.6–11.0)

## 2013-01-25 ENCOUNTER — Ambulatory Visit (INDEPENDENT_AMBULATORY_CARE_PROVIDER_SITE_OTHER): Payer: Medicare Other | Admitting: Podiatry

## 2013-01-25 ENCOUNTER — Encounter: Payer: Self-pay | Admitting: Podiatry

## 2013-01-25 VITALS — BP 134/70 | HR 85 | Resp 16 | Ht 63.0 in | Wt 144.0 lb

## 2013-01-25 DIAGNOSIS — B351 Tinea unguium: Secondary | ICD-10-CM

## 2013-01-25 DIAGNOSIS — M79609 Pain in unspecified limb: Secondary | ICD-10-CM

## 2013-01-25 NOTE — Progress Notes (Signed)
She presents today for routine nail debridement 1 through 5 bilateral is cover service because of a painful elongated nails and she is experiencing. She also has recently had a bout of cellulitis along the tibial border of the hallux right do to unknown etiology.  Objective: Vital signs are stable she is alert and oriented x3 pulses are strongly palpable bilateral. Nails are thick yellow dystrophic clinically mycotic currently there is no erythema edema saline is drainage or odor to the hallux right.  Assessment: Pain in limb secondary to onychomycosis 1 through 5 bilateral. No signs of cellulitis hallux right.  Plan: Continue to soak in Epsom salts and water if it makes the right foot feel better otherwise debridement nails 1 through 5 today's bilateral covered service.

## 2013-01-26 ENCOUNTER — Encounter: Payer: Self-pay | Admitting: Pulmonary Disease

## 2013-01-26 ENCOUNTER — Ambulatory Visit: Payer: Self-pay | Admitting: Oncology

## 2013-02-07 ENCOUNTER — Ambulatory Visit: Payer: Medicare Other | Admitting: Pulmonary Disease

## 2013-02-15 ENCOUNTER — Ambulatory Visit: Payer: Medicare Other | Admitting: Podiatry

## 2013-02-26 ENCOUNTER — Encounter: Payer: Self-pay | Admitting: Pulmonary Disease

## 2013-02-28 ENCOUNTER — Ambulatory Visit (INDEPENDENT_AMBULATORY_CARE_PROVIDER_SITE_OTHER): Payer: Medicare Other | Admitting: Pulmonary Disease

## 2013-02-28 ENCOUNTER — Encounter: Payer: Self-pay | Admitting: Pulmonary Disease

## 2013-02-28 VITALS — BP 124/62 | HR 73 | Ht 63.0 in | Wt 145.0 lb

## 2013-02-28 DIAGNOSIS — J9610 Chronic respiratory failure, unspecified whether with hypoxia or hypercapnia: Secondary | ICD-10-CM

## 2013-02-28 DIAGNOSIS — J449 Chronic obstructive pulmonary disease, unspecified: Secondary | ICD-10-CM

## 2013-02-28 NOTE — Assessment & Plan Note (Signed)
This has been a stable interval for [name].  She continues to participate in pulmonary rehab.  Plan: -continue pulm rehab -continue Dulera with spacer and duoneb prn -f/u 6 months

## 2013-02-28 NOTE — Progress Notes (Signed)
Subjective:    Patient ID: [name], female    DOB: 1921-02-01, 80 y.o.   MRN: [id number]   Synopsis: [name] has gold grade D COPD. 8 2013 simple spirometry showed an FEV1 of 650 cc, 40% predicted with clear airflow obstruction. She uses 2 L of oxygen at rest and 3 L with exertion. She has previously completed lung works at [facility].  HPI   08/23/2012 routine office visit>> This is a patient of Dr. Delford FieldWright 2 is now transferring her care to us because she was in [place]. She has gold grade D. COPD and uses 2 L of oxygen at rest and 3 L with exertion. She smoked one pack of cigarettes daily for nearly 70 years and quit in 2011. In general, she thinks things are going okay but she does feel that she is getting weaker. She states that she feels that overall fatigue as well as progression in her dyspnea with any activity. She has not been active at all with any exercise and is interested in starting pulmonary rehabilitation again. She is also frustrated by the time it takes for her to use her duo neb 4 times a day. She does feel a clear benefit from using it. She notes that she has gained 20-30 pounds since quitting smoking 3 years ago. She continues to use and benefit from her oxygen.  10/04/2012 ROV> Felice feels like Elwin SleightDulera has made a tremendous difference in her breathing.  She feels some soreness in her upper back, in the middle ever since her recent mamogram.  It comes and goes, and is not a consistent pain. It is not a sharp pain, but feels like something is interfering with getting a deep breath. She has not had a recent falls.  She coughs sometimes when holding her breath after the dulera.  She feels that the dulera has made her more independent.  Energy level is better since last visit.  02/28/2013 ROV >> Aiana thinks that she has been doing pretty well since the last visit.  She does not have any back pain any more.  She can't think of any respiratory  problems right now.  [facility] and feels like it is helping quite a bit.  She is not having to use the nebulizer as much anymore which she is happy about.  She continues to use the duoneb twice a day in the morning and evening.  She has been suffering from vertigo and has not been able to drive or get around as well as she wants.  This has limited her ability to get to [facility].  She is repeating the original [facility] program.  She believes that she has done a 6 min walk since then.  Past Medical History  Diagnosis Date  . Seasonal allergies   . Osteoarthritis     hands, cervical spine, hips  . Benign breast lumps     and cysts  . FH: cataracts   . Depression   . Diverticulosis   . Hypertension   . Hyperthyroidism     s/p ablation  . Depression   . TIA (transient ischemic attack)   . COPD (chronic obstructive pulmonary disease)   . GERD (gastroesophageal reflux disease)   . Essential tremor   . Chronic UTI      Review of Systems  Constitutional: Positive for fatigue. Negative for fever and chills.  HENT: Negative for congestion, nosebleeds and rhinorrhea.   Respiratory: Positive for  shortness of breath. Negative for cough and wheezing.   Cardiovascular: Negative for chest pain, palpitations and leg swelling.       Objective:   Physical Exam  Filed Vitals:   02/28/13 1208  BP: 124/62  Pulse: 73  Height: 5\' 3"  (1.6 m)  Weight: 145 lb (65.772 kg)  SpO2: 95%  3L [place]  Gen: chronically ill appearing, no acute distress HEENT: NCAT, EOMi,  PULM: Wheeze R base, moving air well today CV: RRR, slight systolic murmur, no JVD AB:  soft, nontender, no hsm Ext: warm, no edema, no clubbing, no cyanosis MSK: point tenderness (mild) over thoracic      Assessment & Plan:   COPD (chronic obstructive pulmonary disease)Gold D This has been a stable interval for [name].  She continues to participate in pulmonary rehab.  Plan: -continue pulm rehab -continue  Dulera with spacer and duoneb prn -f/u 6 months  Chronic respiratory failure Continue 2 L at rest and 3L with exertion    Updated Medication List Outpatient Encounter Prescriptions as of 02/28/2013  Medication Sig  . amiloride-hydrochlorothiazide (MODURETIC) 5-50 MG tablet Take 1 tablet by mouth Daily.  Marland Kitchen aspirin 81 MG tablet Take 81 mg by mouth daily.   . Calcium Carbonate (CALCIUM 500 PO) Take 1 tablet by mouth 2 (two) times daily.  . celecoxib (CELEBREX) 200 MG capsule Take 200 mg by mouth 2 (two) times daily.  . chlorpheniramine (CHLOR-TRIMETON) 4 MG tablet Take 4 mg by mouth 3 (three) times daily as needed.   Marland Kitchen estrogens, conjugated, (PREMARIN) 0.3 MG tablet Take 0.3 mg by mouth 3 (three) times a week. .  . fluocinonide cream (LIDEX) 0.05 % Apply topically as needed.  . hydrocortisone cream 0.5 % Apply topically as needed.  Marland Kitchen ipratropium (ATROVENT) 0.03 % nasal spray Place 2 sprays into the nose 2 (two) times daily as needed.   Marland Kitchen ipratropium-albuterol (DUONEB) 0.5-2.5 (3) MG/3ML SOLN 3 mLs 2 (two) times daily as needed.  Marland Kitchen losartan (COZAAR) 50 MG tablet Take 1 tablet by mouth daily.  Marland Kitchen Lysine HCl 500 MG TABS Take by mouth 2 (two) times daily.  . meclizine (ANTIVERT) 25 MG tablet Take 0.5-1 tablets by mouth Three times daily as needed.  . mometasone-formoterol (DULERA) 100-5 MCG/ACT AERO Inhale 2 puffs into the lungs 2 (two) times daily.  . Naftifine HCl (NAFTIN) 1 % GEL Apply topically as needed.   Marland Kitchen omeprazole (PRILOSEC) 20 MG capsule Take 20 mg by mouth 2 (two) times daily.   . potassium chloride (MICRO-K) 10 MEQ CR capsule Take 1 tablet by mouth.  . Spacer/Aero-Holding Chambers (AEROCHAMBER PLUS) inhaler Use as instructed  . traMADol (ULTRAM) 50 MG tablet Take 50 mg by mouth every 6 (six) hours as needed.  . trimethoprim (TRIMPEX) 100 MG tablet Take 1 tablet by mouth every morning. To help prevent UTI  . [DISCONTINUED] ipratropium-albuterol (DUONEB) 0.5-2.5 (3) MG/3ML SOLN 4   times daily     And as needed

## 2013-02-28 NOTE — Assessment & Plan Note (Signed)
Continue 2 L at rest and 3 L with exertion 

## 2013-02-28 NOTE — Patient Instructions (Signed)
You are doing well Keep using your inhalers and your nebulizers as you are doing Keep using your oxygen as you are doing We will see you back in 6 months or sooner if needed

## 2013-03-29 ENCOUNTER — Ambulatory Visit (INDEPENDENT_AMBULATORY_CARE_PROVIDER_SITE_OTHER): Payer: Medicare Other | Admitting: Podiatry

## 2013-03-29 ENCOUNTER — Encounter: Payer: Self-pay | Admitting: Podiatry

## 2013-03-29 VITALS — BP 99/60 | HR 80 | Resp 20

## 2013-03-29 DIAGNOSIS — M79609 Pain in unspecified limb: Secondary | ICD-10-CM

## 2013-03-29 DIAGNOSIS — B351 Tinea unguium: Secondary | ICD-10-CM

## 2013-03-29 NOTE — Progress Notes (Signed)
To get the toenails cut.  Objective: Vital signs are stable she is alert and oriented x3. She has pain on palpation nails 1 through 5 bilateral.  Assessment: Pain in limb secondary to onychomycosis 1 through 5 bilateral.  Plan: Debridement nails 1 through 5 bilateral.

## 2013-03-31 ENCOUNTER — Encounter: Payer: Self-pay | Admitting: Pulmonary Disease

## 2013-04-19 ENCOUNTER — Telehealth: Payer: Self-pay | Admitting: Pulmonary Disease

## 2013-04-19 MED ORDER — MOMETASONE FURO-FORMOTEROL FUM 100-5 MCG/ACT IN AERO: 2.0000 | INHALATION_SPRAY | Freq: Two times a day (BID) | RESPIRATORY_TRACT | Status: DC

## 2013-04-19 NOTE — Telephone Encounter (Signed)
RX has been refilled for dulera. Nothing further needed

## 2013-04-21 ENCOUNTER — Ambulatory Visit: Payer: Self-pay | Admitting: Oncology

## 2013-04-24 ENCOUNTER — Ambulatory Visit: Payer: Self-pay | Admitting: Oncology

## 2013-04-24 LAB — CBC CANCER CENTER
Basophil #: 0 x10 3/mm (ref 0.0–0.1)
Basophil %: 0.3 %
EOS ABS: 0.1 x10 3/mm (ref 0.0–0.7)
Eosinophil %: 1.2 %
HCT: 34.7 % — ABNORMAL LOW (ref 35.0–47.0)
HGB: 11 g/dL — AB (ref 12.0–16.0)
Lymphocyte #: 1.5 x10 3/mm (ref 1.0–3.6)
Lymphocyte %: 25.9 %
MCH: 30.6 pg (ref 26.0–34.0)
MCHC: 31.8 g/dL — ABNORMAL LOW (ref 32.0–36.0)
MCV: 96 fL (ref 80–100)
Monocyte #: 0.7 x10 3/mm (ref 0.2–0.9)
Monocyte %: 12.1 %
Neutrophil #: 3.4 x10 3/mm (ref 1.4–6.5)
Neutrophil %: 60.5 %
PLATELETS: 159 x10 3/mm (ref 150–440)
RBC: 3.6 10*6/uL — ABNORMAL LOW (ref 3.80–5.20)
RDW: 14.3 % (ref 11.5–14.5)
WBC: 5.6 x10 3/mm (ref 3.6–11.0)

## 2013-04-24 LAB — IRON AND TIBC
IRON BIND. CAP.(TOTAL): 400 ug/dL (ref 250–450)
IRON: 63 ug/dL (ref 50–170)
Iron Saturation: 16 %
Unbound Iron-Bind.Cap.: 337 ug/dL

## 2013-04-24 LAB — FERRITIN: FERRITIN (ARMC): 17 ng/mL (ref 8–388)

## 2013-04-26 ENCOUNTER — Encounter: Payer: Self-pay | Admitting: Pulmonary Disease

## 2013-04-26 ENCOUNTER — Ambulatory Visit: Payer: Self-pay | Admitting: Oncology

## 2013-04-26 ENCOUNTER — Telehealth: Payer: Self-pay | Admitting: Pulmonary Disease

## 2013-04-26 NOTE — Telephone Encounter (Signed)
Called spoke with patient who c/o "irritating" dry cough, tightness in her upper chest, occasional wheezing, some increased SOB x2-3 weeks.  Denies any mucus production, congestion, f/c/s, hemoptysis, nausea, vomiting, edema.  [facility] [address] [place].  Pt stated that she would like BQ to answer this rather than sending to another physician and is unconcerned about getting a response today.    Dr Kendrick FriesMcQuaid please advise, thank you.  Allergies  Allergen Reactions  . Aspirin     GI upset  . Bactrim     Severe stomach cramps and diarrhea  . Lactose Intolerance (Gi)   . Levetiracetam     Fatigue, onset 06/13/10  . Penicillins Itching  . Septra [Sulfamethoxazole-Trimethoprim]     Nausea and diarrhea  . Sulfa Antibiotics     Severe nausea  . Tape    Per last ov on 2.3.15:  Patient Instructions     You are doing well  Keep using your inhalers and your nebulizers as you are doing  Keep using your oxygen as you are doing  We will see you back in 6 months or sooner if needed

## 2013-04-26 NOTE — Telephone Encounter (Signed)
Prednisone: Take 40mg  po daily for 3 days, then take 30mg  po daily for 3 days, then take 20mg  po daily for two days, then take 10mg  po daily for 2 days Doxycycline 100 mg po bid x7 days

## 2013-04-27 MED ORDER — PREDNISONE 10 MG PO TABS: ORAL_TABLET | ORAL | Status: DC

## 2013-04-27 MED ORDER — DOXYCYCLINE HYCLATE 100 MG PO TABS: 100.0000 mg | ORAL_TABLET | Freq: Two times a day (BID) | ORAL | Status: DC

## 2013-04-27 NOTE — Telephone Encounter (Signed)
Spoke with pt. She is aware of BQ's recs. Rx's have been called in. Nothing further was needed.

## 2013-04-30 ENCOUNTER — Emergency Department: Payer: Self-pay | Admitting: Emergency Medicine

## 2013-04-30 LAB — CBC
HCT: 34.1 % — AB (ref 35.0–47.0)
HGB: 11.1 g/dL — ABNORMAL LOW (ref 12.0–16.0)
MCH: 31.5 pg (ref 26.0–34.0)
MCHC: 32.7 g/dL (ref 32.0–36.0)
MCV: 96 fL (ref 80–100)
Platelet: 187 10*3/uL (ref 150–440)
RBC: 3.54 10*6/uL — AB (ref 3.80–5.20)
RDW: 14.4 % (ref 11.5–14.5)
WBC: 8 10*3/uL (ref 3.6–11.0)

## 2013-04-30 LAB — COMPREHENSIVE METABOLIC PANEL
ALBUMIN: 3.4 g/dL (ref 3.4–5.0)
ALK PHOS: 70 U/L
ALT: 26 U/L (ref 12–78)
ANION GAP: 4 — AB (ref 7–16)
AST: 27 U/L (ref 15–37)
BILIRUBIN TOTAL: 0.2 mg/dL (ref 0.2–1.0)
BUN: 34 mg/dL — AB (ref 7–18)
CALCIUM: 8.6 mg/dL (ref 8.5–10.1)
CREATININE: 0.79 mg/dL (ref 0.60–1.30)
Chloride: 102 mmol/L (ref 98–107)
Co2: 32 mmol/L (ref 21–32)
EGFR (African American): >60
EGFR (Non-African Amer.): >60
Glucose: 89 mg/dL (ref 65–99)
Osmolality: 283 (ref 275–301)
Potassium: 3.5 mmol/L (ref 3.5–5.1)
Sodium: 138 mmol/L (ref 136–145)
TOTAL PROTEIN: 7.1 g/dL (ref 6.4–8.2)

## 2013-04-30 LAB — TROPONIN I: Troponin-I: <0.02 ng/mL

## 2013-05-09 ENCOUNTER — Ambulatory Visit: Payer: Self-pay | Admitting: Oncology

## 2013-05-09 ENCOUNTER — Encounter: Payer: Self-pay | Admitting: Pulmonary Disease

## 2013-05-24 ENCOUNTER — Ambulatory Visit: Payer: Medicare Other | Admitting: Podiatry

## 2013-05-31 ENCOUNTER — Ambulatory Visit (INDEPENDENT_AMBULATORY_CARE_PROVIDER_SITE_OTHER): Payer: Medicare Other | Admitting: Podiatry

## 2013-05-31 ENCOUNTER — Encounter: Payer: Self-pay | Admitting: Podiatry

## 2013-05-31 VITALS — BP 118/68 | HR 88 | Resp 18

## 2013-05-31 DIAGNOSIS — M79609 Pain in unspecified limb: Secondary | ICD-10-CM

## 2013-05-31 DIAGNOSIS — B351 Tinea unguium: Secondary | ICD-10-CM

## 2013-05-31 NOTE — Progress Notes (Signed)
She presents today chief complaint of painful elongated toenails bilateral.  Objective: Nails are thick yellow dystrophic with mycotic and painful palpation. Pulses are palpable bilateral.  Assessment: Pain in limb secondary to onychomycosis 1 through 5 bilateral.  Plan: Debridement of nails 1 through 5 bilateral.

## 2013-06-22 ENCOUNTER — Ambulatory Visit: Payer: Self-pay | Admitting: Physical Medicine and Rehabilitation

## 2013-06-26 ENCOUNTER — Ambulatory Visit: Payer: Self-pay | Admitting: Internal Medicine

## 2013-07-12 LAB — BASIC METABOLIC PANEL
Anion Gap: 4 — ABNORMAL LOW (ref 7–16)
BUN: 29 mg/dL — ABNORMAL HIGH (ref 7–18)
CHLORIDE: 94 mmol/L — AB (ref 98–107)
Calcium, Total: 8.3 mg/dL — ABNORMAL LOW (ref 8.5–10.1)
Co2: 32 mmol/L (ref 21–32)
Creatinine: 0.88 mg/dL (ref 0.60–1.30)
EGFR (African American): >60
GFR CALC NON AF AMER: 58 — AB
Glucose: 104 mg/dL — ABNORMAL HIGH (ref 65–99)
Osmolality: 267 (ref 275–301)
Potassium: 3.7 mmol/L (ref 3.5–5.1)
Sodium: 130 mmol/L — ABNORMAL LOW (ref 136–145)

## 2013-07-12 LAB — TROPONIN I

## 2013-07-12 LAB — CBC
HCT: 31.9 % — ABNORMAL LOW (ref 35.0–47.0)
HGB: 10.6 g/dL — ABNORMAL LOW (ref 12.0–16.0)
MCH: 31.9 pg (ref 26.0–34.0)
MCHC: 33.2 g/dL (ref 32.0–36.0)
MCV: 96 fL (ref 80–100)
Platelet: 260 10*3/uL (ref 150–440)
RBC: 3.33 10*6/uL — ABNORMAL LOW (ref 3.80–5.20)
RDW: 14.8 % — ABNORMAL HIGH (ref 11.5–14.5)
WBC: 6.9 10*3/uL (ref 3.6–11.0)

## 2013-07-13 ENCOUNTER — Inpatient Hospital Stay: Payer: Self-pay | Admitting: Internal Medicine

## 2013-07-13 LAB — D-DIMER(ARMC): D-DIMER: 808 ng/mL

## 2013-07-13 LAB — CBC WITH DIFFERENTIAL/PLATELET
BASOS ABS: 0 10*3/uL (ref 0.0–0.1)
BASOS PCT: 0.3 %
EOS ABS: 0.1 10*3/uL (ref 0.0–0.7)
Eosinophil %: 0.5 %
HCT: 32.9 % — ABNORMAL LOW (ref 35.0–47.0)
HGB: 11 g/dL — ABNORMAL LOW (ref 12.0–16.0)
LYMPHS ABS: 1 10*3/uL (ref 1.0–3.6)
LYMPHS PCT: 10.8 %
MCH: 32.1 pg (ref 26.0–34.0)
MCHC: 33.5 g/dL (ref 32.0–36.0)
MCV: 96 fL (ref 80–100)
Monocyte #: 1.2 x10 3/mm — ABNORMAL HIGH (ref 0.2–0.9)
Monocyte %: 12.5 %
Neutrophil #: 7.1 10*3/uL — ABNORMAL HIGH (ref 1.4–6.5)
Neutrophil %: 75.9 %
PLATELETS: 267 10*3/uL (ref 150–440)
RBC: 3.44 10*6/uL — ABNORMAL LOW (ref 3.80–5.20)
RDW: 15 % — AB (ref 11.5–14.5)
WBC: 9.3 10*3/uL (ref 3.6–11.0)

## 2013-07-13 LAB — CK-MB
CK-MB: 2.8 ng/mL (ref 0.5–3.6)
CK-MB: 8.2 ng/mL — ABNORMAL HIGH (ref 0.5–3.6)
CK-MB: 10.3 ng/mL — ABNORMAL HIGH (ref 0.5–3.6)

## 2013-07-13 LAB — CK TOTAL AND CKMB (NOT AT ARMC)
CK, Total: 104 U/L
CK-MB: 1.9 ng/mL (ref 0.5–3.6)

## 2013-07-13 LAB — APTT
Activated PTT: 31.9 secs (ref 23.6–35.9)
Activated PTT: 122.3 secs — ABNORMAL HIGH (ref 23.6–35.9)

## 2013-07-13 LAB — TROPONIN I
Troponin-I: 0.13 ng/mL — ABNORMAL HIGH
Troponin-I: 1.9 ng/mL — ABNORMAL HIGH

## 2013-07-14 LAB — BASIC METABOLIC PANEL
ANION GAP: 2 — AB (ref 7–16)
BUN: 32 mg/dL — AB (ref 7–18)
CHLORIDE: 91 mmol/L — AB (ref 98–107)
Calcium, Total: 8.9 mg/dL (ref 8.5–10.1)
Co2: 36 mmol/L — ABNORMAL HIGH (ref 21–32)
Creatinine: 0.91 mg/dL (ref 0.60–1.30)
EGFR (African American): >60
EGFR (Non-African Amer.): 56 — ABNORMAL LOW
GLUCOSE: 95 mg/dL (ref 65–99)
OSMOLALITY: 266 (ref 275–301)
Potassium: 3.5 mmol/L (ref 3.5–5.1)
Sodium: 129 mmol/L — ABNORMAL LOW (ref 136–145)

## 2013-07-14 LAB — APTT: Activated PTT: 146.4 secs — ABNORMAL HIGH (ref 23.6–35.9)

## 2013-07-14 LAB — HEMOGLOBIN: HGB: 11.8 g/dL — ABNORMAL LOW (ref 12.0–16.0)

## 2013-07-14 LAB — PLATELET COUNT: Platelet: 285 10*3/uL (ref 150–440)

## 2013-07-14 LAB — TROPONIN I: TROPONIN-I: 1.5 ng/mL — AB

## 2013-07-15 LAB — BASIC METABOLIC PANEL
Anion Gap: 6 — ABNORMAL LOW (ref 7–16)
BUN: 22 mg/dL — AB (ref 7–18)
CO2: 33 mmol/L — AB (ref 21–32)
Calcium, Total: 8.4 mg/dL — ABNORMAL LOW (ref 8.5–10.1)
Chloride: 92 mmol/L — ABNORMAL LOW (ref 98–107)
Creatinine: 0.74 mg/dL (ref 0.60–1.30)
EGFR (African American): >60
EGFR (Non-African Amer.): >60
GLUCOSE: 101 mg/dL — AB (ref 65–99)
Osmolality: 266 (ref 275–301)
POTASSIUM: 3.4 mmol/L — AB (ref 3.5–5.1)
Sodium: 131 mmol/L — ABNORMAL LOW (ref 136–145)

## 2013-07-16 LAB — BASIC METABOLIC PANEL
Anion Gap: 6 — ABNORMAL LOW (ref 7–16)
BUN: 23 mg/dL — AB (ref 7–18)
Calcium, Total: 8.8 mg/dL (ref 8.5–10.1)
Chloride: 95 mmol/L — ABNORMAL LOW (ref 98–107)
Co2: 32 mmol/L (ref 21–32)
Creatinine: 0.89 mg/dL (ref 0.60–1.30)
EGFR (African American): >60
GFR CALC NON AF AMER: 57 — AB
Glucose: 88 mg/dL (ref 65–99)
OSMOLALITY: 269 (ref 275–301)
Potassium: 3.8 mmol/L (ref 3.5–5.1)
Sodium: 133 mmol/L — ABNORMAL LOW (ref 136–145)

## 2013-07-16 LAB — MAGNESIUM: Magnesium: 1.9 mg/dL

## 2013-07-17 LAB — POTASSIUM: POTASSIUM: 3.7 mmol/L (ref 3.5–5.1)

## 2013-07-18 LAB — CBC WITH DIFFERENTIAL/PLATELET
BASOS PCT: 0.2 %
Basophil #: 0 10*3/uL (ref 0.0–0.1)
Eosinophil #: 0.1 10*3/uL (ref 0.0–0.7)
Eosinophil %: 2.3 %
HCT: 30.8 % — ABNORMAL LOW (ref 35.0–47.0)
HGB: 9.9 g/dL — ABNORMAL LOW (ref 12.0–16.0)
LYMPHS ABS: 1.3 10*3/uL (ref 1.0–3.6)
LYMPHS PCT: 23 %
MCH: 31.1 pg (ref 26.0–34.0)
MCHC: 32.1 g/dL (ref 32.0–36.0)
MCV: 97 fL (ref 80–100)
MONO ABS: 0.8 x10 3/mm (ref 0.2–0.9)
Monocyte %: 13.3 %
NEUTROS ABS: 3.6 10*3/uL (ref 1.4–6.5)
Neutrophil %: 61.2 %
Platelet: 188 10*3/uL (ref 150–440)
RBC: 3.18 10*6/uL — AB (ref 3.80–5.20)
RDW: 14.7 % — ABNORMAL HIGH (ref 11.5–14.5)
WBC: 5.9 10*3/uL (ref 3.6–11.0)

## 2013-07-18 LAB — BASIC METABOLIC PANEL
Anion Gap: 8 (ref 7–16)
BUN: 13 mg/dL (ref 7–18)
CALCIUM: 8.5 mg/dL (ref 8.5–10.1)
CREATININE: 0.55 mg/dL — AB (ref 0.60–1.30)
Chloride: 95 mmol/L — ABNORMAL LOW (ref 98–107)
Co2: 30 mmol/L (ref 21–32)
EGFR (African American): >60
Glucose: 91 mg/dL (ref 65–99)
OSMOLALITY: 266 (ref 275–301)
POTASSIUM: 4.1 mmol/L (ref 3.5–5.1)
Sodium: 133 mmol/L — ABNORMAL LOW (ref 136–145)

## 2013-07-19 LAB — CBC WITH DIFFERENTIAL/PLATELET
BASOS ABS: 0 10*3/uL (ref 0.0–0.1)
Basophil %: 0.2 %
Eosinophil #: 0.1 10*3/uL (ref 0.0–0.7)
Eosinophil %: 1.8 %
HCT: 31.8 % — ABNORMAL LOW (ref 35.0–47.0)
HGB: 10.6 g/dL — AB (ref 12.0–16.0)
Lymphocyte #: 1.3 10*3/uL (ref 1.0–3.6)
Lymphocyte %: 18.1 %
MCH: 32.2 pg (ref 26.0–34.0)
MCHC: 33.4 g/dL (ref 32.0–36.0)
MCV: 97 fL (ref 80–100)
MONO ABS: 1.2 x10 3/mm — AB (ref 0.2–0.9)
MONOS PCT: 16.5 %
NEUTROS PCT: 63.4 %
Neutrophil #: 4.7 10*3/uL (ref 1.4–6.5)
PLATELETS: 170 10*3/uL (ref 150–440)
RBC: 3.3 10*6/uL — AB (ref 3.80–5.20)
RDW: 14.9 % — AB (ref 11.5–14.5)
WBC: 7.4 10*3/uL (ref 3.6–11.0)

## 2013-07-20 LAB — CBC WITH DIFFERENTIAL/PLATELET
BASOS ABS: 0 10*3/uL (ref 0.0–0.1)
BASOS PCT: 0.3 %
Eosinophil #: 0.1 10*3/uL (ref 0.0–0.7)
Eosinophil %: 2 %
HCT: 30 % — ABNORMAL LOW (ref 35.0–47.0)
HGB: 10 g/dL — AB (ref 12.0–16.0)
LYMPHS ABS: 1.3 10*3/uL (ref 1.0–3.6)
Lymphocyte %: 22.5 %
MCH: 32 pg (ref 26.0–34.0)
MCHC: 33.2 g/dL (ref 32.0–36.0)
MCV: 97 fL (ref 80–100)
MONOS PCT: 17.2 %
Monocyte #: 1 x10 3/mm — ABNORMAL HIGH (ref 0.2–0.9)
Neutrophil #: 3.4 10*3/uL (ref 1.4–6.5)
Neutrophil %: 58 %
Platelet: 158 10*3/uL (ref 150–440)
RBC: 3.11 10*6/uL — AB (ref 3.80–5.20)
RDW: 14.6 % — AB (ref 11.5–14.5)
WBC: 5.8 10*3/uL (ref 3.6–11.0)

## 2013-07-21 DIAGNOSIS — J449 Chronic obstructive pulmonary disease, unspecified: Secondary | ICD-10-CM

## 2013-07-21 DIAGNOSIS — I10 Essential (primary) hypertension: Secondary | ICD-10-CM

## 2013-07-21 DIAGNOSIS — F411 Generalized anxiety disorder: Secondary | ICD-10-CM

## 2013-07-21 DIAGNOSIS — D649 Anemia, unspecified: Secondary | ICD-10-CM

## 2013-07-21 DIAGNOSIS — F3289 Other specified depressive episodes: Secondary | ICD-10-CM

## 2013-07-21 DIAGNOSIS — I2119 ST elevation (STEMI) myocardial infarction involving other coronary artery of inferior wall: Secondary | ICD-10-CM

## 2013-07-21 DIAGNOSIS — F329 Major depressive disorder, single episode, unspecified: Secondary | ICD-10-CM

## 2013-07-24 ENCOUNTER — Emergency Department: Payer: Self-pay | Admitting: Emergency Medicine

## 2013-07-24 LAB — URINALYSIS, COMPLETE
Bacteria: NONE SEEN
Bilirubin,UR: NEGATIVE
Glucose,UR: NEGATIVE mg/dL (ref 0–75)
KETONE: NEGATIVE
Nitrite: NEGATIVE
PH: 6 (ref 4.5–8.0)
Protein: NEGATIVE
SPECIFIC GRAVITY: 1.008 (ref 1.003–1.030)
SQUAMOUS EPITHELIAL: NONE SEEN
WBC UR: 8 /HPF (ref 0–5)

## 2013-07-24 LAB — COMPREHENSIVE METABOLIC PANEL
ALK PHOS: 56 U/L
ANION GAP: 4 — AB (ref 7–16)
AST: 28 U/L (ref 15–37)
Albumin: 3.4 g/dL (ref 3.4–5.0)
BUN: 24 mg/dL — AB (ref 7–18)
Bilirubin,Total: 0.2 mg/dL (ref 0.2–1.0)
CALCIUM: 9 mg/dL (ref 8.5–10.1)
CO2: 35 mmol/L — AB (ref 21–32)
CREATININE: 0.98 mg/dL (ref 0.60–1.30)
Chloride: 90 mmol/L — ABNORMAL LOW (ref 98–107)
EGFR (African American): 59 — ABNORMAL LOW
GFR CALC NON AF AMER: 51 — AB
Glucose: 93 mg/dL (ref 65–99)
OSMOLALITY: 263 (ref 275–301)
Potassium: 3.9 mmol/L (ref 3.5–5.1)
SGPT (ALT): 26 U/L (ref 12–78)
Sodium: 129 mmol/L — ABNORMAL LOW (ref 136–145)
TOTAL PROTEIN: 6.7 g/dL (ref 6.4–8.2)

## 2013-07-24 LAB — CBC
HCT: 30.1 % — AB (ref 35.0–47.0)
HGB: 9.8 g/dL — ABNORMAL LOW (ref 12.0–16.0)
MCH: 30.9 pg (ref 26.0–34.0)
MCHC: 32.5 g/dL (ref 32.0–36.0)
MCV: 95 fL (ref 80–100)
Platelet: 140 10*3/uL — ABNORMAL LOW (ref 150–440)
RBC: 3.15 10*6/uL — ABNORMAL LOW (ref 3.80–5.20)
RDW: 14.9 % — AB (ref 11.5–14.5)
WBC: 6.6 10*3/uL (ref 3.6–11.0)

## 2013-07-24 LAB — PROTIME-INR
INR: 0.9
Prothrombin Time: 12.4 secs (ref 11.5–14.7)

## 2013-07-24 LAB — MAGNESIUM: MAGNESIUM: 1.6 mg/dL — AB

## 2013-07-24 LAB — TROPONIN I
TROPONIN-I: 0.21 ng/mL — AB
Troponin-I: 0.23 ng/mL — ABNORMAL HIGH

## 2013-07-24 LAB — LIPASE, BLOOD: LIPASE: 125 U/L (ref 73–393)

## 2013-07-26 ENCOUNTER — Ambulatory Visit: Payer: Self-pay | Admitting: Internal Medicine

## 2013-07-26 ENCOUNTER — Ambulatory Visit
Admit: 2013-07-26 | Disposition: A | Discharge status: Home / Self Care | Payer: Self-pay | Attending: Nurse Practitioner | Admitting: Nurse Practitioner

## 2013-08-07 ENCOUNTER — Ambulatory Visit: Payer: Medicare Other | Admitting: Podiatry

## 2013-08-07 DIAGNOSIS — J449 Chronic obstructive pulmonary disease, unspecified: Secondary | ICD-10-CM

## 2013-08-07 DIAGNOSIS — F411 Generalized anxiety disorder: Secondary | ICD-10-CM

## 2013-08-08 ENCOUNTER — Other Ambulatory Visit: Payer: Self-pay | Admitting: Internal Medicine

## 2013-08-14 ENCOUNTER — Inpatient Hospital Stay: Payer: Self-pay | Admitting: Internal Medicine

## 2013-08-14 LAB — CK-MB: CK-MB: 3.7 ng/mL — AB (ref 0.5–3.6)

## 2013-08-14 LAB — LIPID PANEL
Cholesterol: 133 mg/dL (ref 0–200)
HDL Cholesterol: 59 mg/dL (ref 40–60)
LDL CHOLESTEROL, CALC: 61 mg/dL (ref 0–100)
Triglycerides: 66 mg/dL (ref 0–200)
VLDL CHOLESTEROL, CALC: 13 mg/dL (ref 5–40)

## 2013-08-14 LAB — CBC
HCT: 30.8 % — AB (ref 35.0–47.0)
HGB: 9.9 g/dL — AB (ref 12.0–16.0)
MCH: 30.6 pg (ref 26.0–34.0)
MCHC: 32.1 g/dL (ref 32.0–36.0)
MCV: 96 fL (ref 80–100)
PLATELETS: 249 10*3/uL (ref 150–440)
RBC: 3.22 10*6/uL — ABNORMAL LOW (ref 3.80–5.20)
RDW: 14.3 % (ref 11.5–14.5)
WBC: 7.1 10*3/uL (ref 3.6–11.0)

## 2013-08-14 LAB — PROTIME-INR
INR: 0.9
Prothrombin Time: 12.5 secs (ref 11.5–14.7)

## 2013-08-14 LAB — BASIC METABOLIC PANEL
Anion Gap: 7 (ref 7–16)
BUN: 27 mg/dL — ABNORMAL HIGH (ref 7–18)
CALCIUM: 8.9 mg/dL (ref 8.5–10.1)
Chloride: 92 mmol/L — ABNORMAL LOW (ref 98–107)
Co2: 33 mmol/L — ABNORMAL HIGH (ref 21–32)
Creatinine: 1.07 mg/dL (ref 0.60–1.30)
EGFR (African American): 53 — ABNORMAL LOW
EGFR (Non-African Amer.): 46 — ABNORMAL LOW
Glucose: 122 mg/dL — ABNORMAL HIGH (ref 65–99)
OSMOLALITY: 271 (ref 275–301)
Potassium: 3.9 mmol/L (ref 3.5–5.1)
SODIUM: 132 mmol/L — AB (ref 136–145)

## 2013-08-14 LAB — APTT: Activated PTT: 34.4 secs (ref 23.6–35.9)

## 2013-08-14 LAB — PRO B NATRIURETIC PEPTIDE: B-Type Natriuretic Peptide: 1712 pg/mL — ABNORMAL HIGH (ref 0–450)

## 2013-08-14 LAB — TROPONIN I: TROPONIN-I: 0.85 ng/mL — AB

## 2013-08-15 LAB — BASIC METABOLIC PANEL
Anion Gap: 7 (ref 7–16)
BUN: 24 mg/dL — ABNORMAL HIGH (ref 7–18)
CALCIUM: 8.7 mg/dL (ref 8.5–10.1)
CREATININE: 1.05 mg/dL (ref 0.60–1.30)
Chloride: 94 mmol/L — ABNORMAL LOW (ref 98–107)
Co2: 33 mmol/L — ABNORMAL HIGH (ref 21–32)
EGFR (Non-African Amer.): 47 — ABNORMAL LOW
GFR CALC AF AMER: 54 — AB
GLUCOSE: 110 mg/dL — AB (ref 65–99)
Osmolality: 273 (ref 275–301)
Potassium: 3.4 mmol/L — ABNORMAL LOW (ref 3.5–5.1)
Sodium: 134 mmol/L — ABNORMAL LOW (ref 136–145)

## 2013-08-15 LAB — URINALYSIS, COMPLETE
BILIRUBIN, UR: NEGATIVE
BLOOD: NEGATIVE
Glucose,UR: NEGATIVE mg/dL (ref 0–75)
KETONE: NEGATIVE
Leukocyte Esterase: NEGATIVE
Nitrite: POSITIVE
Ph: 8 (ref 4.5–8.0)
Protein: NEGATIVE
Specific Gravity: 1.008 (ref 1.003–1.030)

## 2013-08-15 LAB — CBC WITH DIFFERENTIAL/PLATELET
BASOS PCT: 0.4 %
Basophil #: 0 10*3/uL (ref 0.0–0.1)
Eosinophil #: 0.2 10*3/uL (ref 0.0–0.7)
Eosinophil %: 4 %
HCT: 28 % — AB (ref 35.0–47.0)
HGB: 9 g/dL — AB (ref 12.0–16.0)
Lymphocyte #: 1.4 10*3/uL (ref 1.0–3.6)
Lymphocyte %: 27.2 %
MCH: 30.7 pg (ref 26.0–34.0)
MCHC: 32.2 g/dL (ref 32.0–36.0)
MCV: 95 fL (ref 80–100)
MONOS PCT: 17.9 %
Monocyte #: 0.9 x10 3/mm (ref 0.2–0.9)
NEUTROS PCT: 50.5 %
Neutrophil #: 2.6 10*3/uL (ref 1.4–6.5)
Platelet: 211 10*3/uL (ref 150–440)
RBC: 2.94 10*6/uL — AB (ref 3.80–5.20)
RDW: 14.4 % (ref 11.5–14.5)
WBC: 5.2 10*3/uL (ref 3.6–11.0)

## 2013-08-15 LAB — HEPARIN LEVEL (UNFRACTIONATED)
ANTI-XA(UNFRACTIONATED): 0.23 [IU]/mL — AB (ref 0.30–0.70)
ANTI-XA(UNFRACTIONATED): 0.89 [IU]/mL — AB (ref 0.30–0.70)
ANTI-XA(UNFRACTIONATED): 0.37 [IU]/mL (ref 0.30–0.70)

## 2013-08-15 LAB — TROPONIN I: Troponin-I: 0.71 ng/mL — ABNORMAL HIGH

## 2013-08-15 LAB — CK-MB
CK-MB: 2.6 ng/mL (ref 0.5–3.6)
CK-MB: 3.2 ng/mL (ref 0.5–3.6)

## 2013-08-16 ENCOUNTER — Telehealth: Payer: Self-pay | Admitting: *Deleted

## 2013-08-16 LAB — CBC WITH DIFFERENTIAL/PLATELET
BASOS ABS: 0 10*3/uL (ref 0.0–0.1)
BASOS PCT: 0.3 %
Eosinophil #: 0.2 10*3/uL (ref 0.0–0.7)
Eosinophil %: 4.3 %
HCT: 29 % — AB (ref 35.0–47.0)
HGB: 9.7 g/dL — AB (ref 12.0–16.0)
Lymphocyte #: 1.6 10*3/uL (ref 1.0–3.6)
Lymphocyte %: 33.3 %
MCH: 31.5 pg (ref 26.0–34.0)
MCHC: 33.3 g/dL (ref 32.0–36.0)
MCV: 95 fL (ref 80–100)
Monocyte #: 0.8 x10 3/mm (ref 0.2–0.9)
Monocyte %: 17.1 %
NEUTROS ABS: 2.2 10*3/uL (ref 1.4–6.5)
NEUTROS PCT: 45 %
PLATELETS: 216 10*3/uL (ref 150–440)
RBC: 3.07 10*6/uL — AB (ref 3.80–5.20)
RDW: 14.6 % — AB (ref 11.5–14.5)
WBC: 4.9 10*3/uL (ref 3.6–11.0)

## 2013-08-16 LAB — HEPARIN LEVEL (UNFRACTIONATED): Anti-Xa(Unfractionated): 0.48 IU/mL (ref 0.30–0.70)

## 2013-08-16 NOTE — Telephone Encounter (Signed)
Please see faxed form on your desk for MORPHINE SULFATE er 15 mg tab, take 1 tablet by mouth every 12 hours

## 2013-08-17 MED ORDER — MORPHINE SULFATE ER 15 MG PO TBCR: 15.0000 mg | EXTENDED_RELEASE_TABLET | Freq: Two times a day (BID) | ORAL | Status: AC

## 2013-08-17 NOTE — Telephone Encounter (Signed)
rx faxed to pharmacy manually, [facility] 

## 2013-08-17 NOTE — Telephone Encounter (Signed)
Order signed.

## 2013-08-21 DIAGNOSIS — I10 Essential (primary) hypertension: Secondary | ICD-10-CM

## 2013-08-21 DIAGNOSIS — I209 Angina pectoris, unspecified: Secondary | ICD-10-CM

## 2013-08-21 DIAGNOSIS — J4489 Other specified chronic obstructive pulmonary disease: Secondary | ICD-10-CM

## 2013-08-21 DIAGNOSIS — I4891 Unspecified atrial fibrillation: Secondary | ICD-10-CM

## 2013-08-21 DIAGNOSIS — G252 Other specified forms of tremor: Secondary | ICD-10-CM

## 2013-08-21 DIAGNOSIS — G250 Essential tremor: Secondary | ICD-10-CM

## 2013-08-21 DIAGNOSIS — M549 Dorsalgia, unspecified: Secondary | ICD-10-CM

## 2013-08-21 DIAGNOSIS — J449 Chronic obstructive pulmonary disease, unspecified: Secondary | ICD-10-CM

## 2013-08-25 DIAGNOSIS — L03039 Cellulitis of unspecified toe: Secondary | ICD-10-CM

## 2013-08-25 DIAGNOSIS — I4891 Unspecified atrial fibrillation: Secondary | ICD-10-CM

## 2013-08-25 DIAGNOSIS — I209 Angina pectoris, unspecified: Secondary | ICD-10-CM

## 2013-08-25 DIAGNOSIS — R079 Chest pain, unspecified: Secondary | ICD-10-CM

## 2013-08-28 DIAGNOSIS — M545 Low back pain, unspecified: Secondary | ICD-10-CM

## 2013-08-28 DIAGNOSIS — K5900 Constipation, unspecified: Secondary | ICD-10-CM

## 2013-09-04 ENCOUNTER — Emergency Department: Payer: Self-pay | Admitting: Emergency Medicine

## 2013-09-04 LAB — BASIC METABOLIC PANEL
Anion Gap: 8 (ref 7–16)
BUN: 28 mg/dL — AB (ref 7–18)
CREATININE: 1.09 mg/dL (ref 0.60–1.30)
Calcium, Total: 8.9 mg/dL (ref 8.5–10.1)
Chloride: 92 mmol/L — ABNORMAL LOW (ref 98–107)
Co2: 34 mmol/L — ABNORMAL HIGH (ref 21–32)
EGFR (African American): 52 — ABNORMAL LOW
EGFR (Non-African Amer.): 45 — ABNORMAL LOW
GLUCOSE: 106 mg/dL — AB (ref 65–99)
OSMOLALITY: 274 (ref 275–301)
POTASSIUM: 3.6 mmol/L (ref 3.5–5.1)
Sodium: 134 mmol/L — ABNORMAL LOW (ref 136–145)

## 2013-09-04 LAB — TROPONIN I: Troponin-I: <0.02 ng/mL

## 2013-09-04 LAB — PRO B NATRIURETIC PEPTIDE: B-Type Natriuretic Peptide: 494 pg/mL — ABNORMAL HIGH (ref 0–450)

## 2013-09-05 LAB — URINALYSIS, COMPLETE
Bilirubin,UR: NEGATIVE
Glucose,UR: NEGATIVE mg/dL (ref 0–75)
Ketone: NEGATIVE
Nitrite: POSITIVE
Ph: 6 (ref 4.5–8.0)
Protein: NEGATIVE
RBC,UR: 11 /HPF (ref 0–5)
Specific Gravity: 1.008 (ref 1.003–1.030)
Squamous Epithelial: NONE SEEN

## 2013-09-05 LAB — TROPONIN I: TROPONIN-I: 0.02 ng/mL

## 2013-09-05 LAB — CBC
HCT: 28.7 % — ABNORMAL LOW (ref 35.0–47.0)
HGB: 9.3 g/dL — ABNORMAL LOW (ref 12.0–16.0)
MCH: 30.6 pg (ref 26.0–34.0)
MCHC: 32.4 g/dL (ref 32.0–36.0)
MCV: 95 fL (ref 80–100)
PLATELETS: 178 10*3/uL (ref 150–440)
RBC: 3.03 10*6/uL — ABNORMAL LOW (ref 3.80–5.20)
RDW: 15.3 % — ABNORMAL HIGH (ref 11.5–14.5)
WBC: 7.3 10*3/uL (ref 3.6–11.0)

## 2013-09-07 LAB — URINE CULTURE

## 2013-09-11 ENCOUNTER — Other Ambulatory Visit: Payer: Self-pay | Admitting: Internal Medicine

## 2013-09-14 ENCOUNTER — Other Ambulatory Visit: Payer: Self-pay | Admitting: Internal Medicine

## 2013-10-06 ENCOUNTER — Ambulatory Visit: Payer: Self-pay | Admitting: Oncology

## 2013-10-06 LAB — IRON AND TIBC
IRON SATURATION: 10 %
Iron Bind.Cap.(Total): 391 ug/dL (ref 250–450)
Iron: 38 ug/dL — ABNORMAL LOW (ref 50–170)
Unbound Iron-Bind.Cap.: 353 ug/dL

## 2013-10-06 LAB — CBC CANCER CENTER
BASOS PCT: 0.2 %
Basophil #: 0 x10 3/mm (ref 0.0–0.1)
EOS ABS: 0.1 x10 3/mm (ref 0.0–0.7)
Eosinophil %: 2 %
HCT: 29.9 % — ABNORMAL LOW (ref 35.0–47.0)
HGB: 9.6 g/dL — ABNORMAL LOW (ref 12.0–16.0)
Lymphocyte #: 1.1 x10 3/mm (ref 1.0–3.6)
Lymphocyte %: 18.3 %
MCH: 30.1 pg (ref 26.0–34.0)
MCHC: 32.2 g/dL (ref 32.0–36.0)
MCV: 94 fL (ref 80–100)
MONOS PCT: 13 %
Monocyte #: 0.8 x10 3/mm (ref 0.2–0.9)
Neutrophil #: 4.2 x10 3/mm (ref 1.4–6.5)
Neutrophil %: 66.5 %
Platelet: 159 x10 3/mm (ref 150–440)
RBC: 3.19 10*6/uL — ABNORMAL LOW (ref 3.80–5.20)
RDW: 15.3 % — ABNORMAL HIGH (ref 11.5–14.5)
WBC: 6.2 x10 3/mm (ref 3.6–11.0)

## 2013-10-06 LAB — FERRITIN: FERRITIN (ARMC): 11 ng/mL (ref 8–388)

## 2013-10-26 ENCOUNTER — Ambulatory Visit: Payer: Self-pay | Admitting: Oncology

## 2013-10-26 ENCOUNTER — Ambulatory Visit: Payer: PRIVATE HEALTH INSURANCE | Admitting: Family Medicine

## 2013-11-07 ENCOUNTER — Telehealth: Payer: Self-pay | Admitting: Pulmonary Disease

## 2013-11-07 MED ORDER — MOMETASONE FURO-FORMOTEROL FUM 100-5 MCG/ACT IN AERO: 2.0000 | INHALATION_SPRAY | Freq: Two times a day (BID) | RESPIRATORY_TRACT | Status: AC

## 2013-11-07 NOTE — Telephone Encounter (Signed)
Called pt. Aware RX sent in. Nothing further needed 

## 2013-11-20 ENCOUNTER — Telehealth: Payer: Self-pay | Admitting: Pulmonary Disease

## 2013-11-20 NOTE — Telephone Encounter (Signed)
atc X3, line busy, not able to leave vm.  WCB.  FYI address to new [place] location is [address] [place] [id number]- [facility]" [facility].

## 2013-11-21 NOTE — Telephone Encounter (Signed)
Spoke with patient-she is aware of new address and nothing more needed at this time.

## 2013-11-30 ENCOUNTER — Ambulatory Visit (INDEPENDENT_AMBULATORY_CARE_PROVIDER_SITE_OTHER): Payer: Medicare Other | Admitting: Podiatry

## 2013-11-30 DIAGNOSIS — B351 Tinea unguium: Secondary | ICD-10-CM

## 2013-11-30 DIAGNOSIS — L84 Corns and callosities: Secondary | ICD-10-CM

## 2013-11-30 DIAGNOSIS — M79676 Pain in unspecified toe(s): Secondary | ICD-10-CM

## 2013-12-01 ENCOUNTER — Other Ambulatory Visit: Payer: Self-pay | Admitting: Internal Medicine

## 2013-12-01 NOTE — Progress Notes (Signed)
Patient ID: [name], female   DOB: 04-Dec-1921, 80 y.o.   MRN: [id number]  Subjective: Patient presents the office they with complaints of painful elongated nails. She states that she is unable to cut the nails herself due to the thickness. Denies any redness or drainage from around the nail sites. She has not been here since May she's been hospitalized for heart attacks. No other complaints at this time. Denies any systemic complaints such as fevers, chills, nausea, vomiting.  Objective: AAO 3, NAD DP/PT pulses palpable bilaterally, CRT less than 3 seconds Protective sensation intact with Simms Weinstein monofilament Nails hypertrophic, dystrophic, elongated, brittle, discolored 10. No surrounding erythema or drainage. No open lesions or pre-ulcerative lesions. No calf pain with compression, swelling, warmth, erythema  Assessment: 80 year old female with symptomatic onychomycosis.  Plan: -Treatment options discussed including alternatives, risks, complications. -Nails sharply debrided 10 without complications -Discussed the importance of daily foot inspection -Follow-up in 3 months or sooner if any problems are to arise or any changes symptoms. In meantime call the office with any questions, concerns.

## 2013-12-05 ENCOUNTER — Telehealth: Payer: Self-pay | Admitting: Pulmonary Disease

## 2013-12-05 NOTE — Telephone Encounter (Signed)
[facility] Was advised refill they called for was already sent in today. Nothing further needed

## 2013-12-09 ENCOUNTER — Emergency Department: Payer: Self-pay | Admitting: Emergency Medicine

## 2013-12-09 LAB — URINALYSIS, COMPLETE
BLOOD: NEGATIVE
Bacteria: NONE SEEN
Bilirubin,UR: NEGATIVE
GLUCOSE, UR: NEGATIVE mg/dL (ref 0–75)
KETONE: NEGATIVE
Nitrite: NEGATIVE
Ph: 6 (ref 4.5–8.0)
Protein: NEGATIVE
RBC,UR: 1 /HPF (ref 0–5)
Specific Gravity: 1.008 (ref 1.003–1.030)
Squamous Epithelial: <1
WBC UR: 2 /HPF (ref 0–5)

## 2013-12-09 LAB — COMPREHENSIVE METABOLIC PANEL
ALBUMIN: 3.5 g/dL (ref 3.4–5.0)
Alkaline Phosphatase: 69 U/L
Anion Gap: 7 (ref 7–16)
BUN: 30 mg/dL — AB (ref 7–18)
Bilirubin,Total: 0.4 mg/dL (ref 0.2–1.0)
CO2: 36 mmol/L — AB (ref 21–32)
Calcium, Total: 8.8 mg/dL (ref 8.5–10.1)
Chloride: 95 mmol/L — ABNORMAL LOW (ref 98–107)
Creatinine: 1.01 mg/dL (ref 0.60–1.30)
EGFR (Non-African Amer.): 55 — ABNORMAL LOW
GLUCOSE: 104 mg/dL — AB (ref 65–99)
OSMOLALITY: 282 (ref 275–301)
POTASSIUM: 3.7 mmol/L (ref 3.5–5.1)
SGOT(AST): 27 U/L (ref 15–37)
SGPT (ALT): 21 U/L
Sodium: 138 mmol/L (ref 136–145)
Total Protein: 6.8 g/dL (ref 6.4–8.2)

## 2013-12-09 LAB — CK TOTAL AND CKMB (NOT AT ARMC)
CK, Total: 66 U/L
CK-MB: 2.7 ng/mL (ref 0.5–3.6)

## 2013-12-09 LAB — CBC
HCT: 35.2 % (ref 35.0–47.0)
HGB: 11.6 g/dL — ABNORMAL LOW (ref 12.0–16.0)
MCH: 32.1 pg (ref 26.0–34.0)
MCHC: 33 g/dL (ref 32.0–36.0)
MCV: 97 fL (ref 80–100)
Platelet: 159 10*3/uL (ref 150–440)
RBC: 3.62 10*6/uL — ABNORMAL LOW (ref 3.80–5.20)
RDW: 15.5 % — ABNORMAL HIGH (ref 11.5–14.5)
WBC: 6.1 10*3/uL (ref 3.6–11.0)

## 2013-12-09 LAB — TROPONIN I: Troponin-I: <0.02 ng/mL

## 2013-12-10 ENCOUNTER — Other Ambulatory Visit: Payer: Self-pay | Admitting: Internal Medicine

## 2013-12-11 ENCOUNTER — Ambulatory Visit (INDEPENDENT_AMBULATORY_CARE_PROVIDER_SITE_OTHER): Payer: Medicare Other | Admitting: Pulmonary Disease

## 2013-12-11 ENCOUNTER — Encounter: Payer: Self-pay | Admitting: Pulmonary Disease

## 2013-12-11 VITALS — BP 102/60 | HR 83 | Ht 63.0 in | Wt 121.4 lb

## 2013-12-11 DIAGNOSIS — J9611 Chronic respiratory failure with hypoxia: Secondary | ICD-10-CM

## 2013-12-11 DIAGNOSIS — J432 Centrilobular emphysema: Secondary | ICD-10-CM

## 2013-12-11 NOTE — Assessment & Plan Note (Signed)
Aside from all her CHF problems lately and the anemia, this has been a stable interval in terms of her COPD.  We went over her spacer technique with the dulera at length today.  Her technique is actually > 90% effective.  Plan: -continue Dulera 2 bid -continue duoneb qid -f/u 6 months

## 2013-12-11 NOTE — Progress Notes (Signed)
Subjective:    Patient ID: [name], female    DOB: 1921-03-05, 80 y.o.   MRN: [id number]   Synopsis: [name] has gold grade D COPD. 8 2013 simple spirometry showed an FEV1 of 650 cc, 40% predicted with clear airflow obstruction. [name] uses 2 L of oxygen at rest and 3 L with exertion. [name] has previously completed lung works at [facility].  HPI  Chief Complaint  Patient presents with  . Follow-up    Pt c/o worsening sob with exertion, increased fatigue.  Pt is unsure if [name] is using her aerochamber correctly.  Denies cough/congestion/chest pain.     12/11/2013 ROV > [name] has been losing weight and has been anemic recently and has a magnesium deficiency according to recent lab work.  [name] has lost ten pounds in the last months after taking lasix regularly since June.  [name] was hospitalized twice in the summer for angina and was in "health care" at [facility] (her assisted living facility).  [name] has been using nitroglycerin as needed for the pain.  [name] has been seeing Drs. Lady GaryFath and Gwen PoundsKowalski recently.  [name] celebrated her 89th birthday in the hospital this year and [name] had a lot of visitors in the hospital.  [name] doesn't feel like her lungs are doing OK.  [name] is not sure if [name] is using the [facility] properly.  Past Medical History  Diagnosis Date  . Seasonal allergies   . Osteoarthritis     hands, cervical spine, hips  . Benign breast lumps     and cysts  . FH: cataracts   . Depression   . Diverticulosis   . Hypertension   . Hyperthyroidism     s/p ablation  . Depression   . TIA (transient ischemic attack)   . COPD (chronic obstructive pulmonary disease)   . GERD (gastroesophageal reflux disease)   . Essential tremor   . Chronic UTI      Review of Systems  Constitutional: Positive for fatigue. Negative for fever and chills.  HENT: Negative for congestion, nosebleeds and rhinorrhea.   Respiratory: Positive for shortness of breath. Negative  for cough and wheezing.   Cardiovascular: Negative for chest pain, palpitations and leg swelling.       Objective:   Physical Exam  Filed Vitals:   12/11/13 1155  BP: 102/60  Pulse: 83  Height: 5\' 3"  (1.6 m)  Weight: 121 lb 6.4 oz (55.067 kg)  SpO2: 100%  3L   Gen: chronically ill appearing, no acute distress HEENT: NCAT, EOMi,  PULM: Wheeze bases, good air movement CV: RRR, slight systolic murmur, no JVD AB:  soft, nontender, Ext: warm, no edema, no clubbing, no cyanosis MSK: point tenderness (mild) over thoracic      Assessment & Plan:   COPD (chronic obstructive pulmonary disease)Gold D Aside from all her CHF problems lately and the anemia, this has been a stable interval in terms of her COPD.  We went over her spacer technique with the dulera at length today.  Her technique is actually > 90% effective.  Plan: -continue Dulera 2 bid -continue duoneb qid -f/u 6 months  Chronic respiratory failure [name] continues to use and benefit from her O2 at 2L at rest, 3L with exertion.    Updated Medication List Outpatient Encounter Prescriptions as of 12/11/2013  Medication Sig  . amiloride-hydrochlorothiazide (MODURETIC) 5-50 MG tablet Take 1 tablet by mouth Daily.  Marland Kitchen. BYSTOLIC 10 MG tablet   . Calcium  Carbonate (CALCIUM 500 PO) Take 1 tablet by mouth 2 (two) times daily.  . celecoxib (CELEBREX) 200 MG capsule Take 200 mg by mouth 2 (two) times daily.  . chlorpheniramine (CHLOR-TRIMETON) 4 MG tablet Take 4 mg by mouth 3 (three) times daily as needed.   Marland Kitchen. estrogens, conjugated, (PREMARIN) 0.3 MG tablet Take 0.3 mg by mouth 3 (three) times a week. .  . fluocinonide cream (LIDEX) 0.05 % Apply topically as needed.  . furosemide (LASIX) 20 MG tablet   . hydrocortisone cream 0.5 % Apply topically as needed.  Marland Kitchen. ipratropium (ATROVENT) 0.03 % nasal spray Place 2 sprays into the nose 2 (two) times daily as needed.   Marland Kitchen. ipratropium-albuterol (DUONEB) 0.5-2.5 (3) MG/3ML SOLN 3 mLs  2 (two) times daily as needed.  Marland Kitchen. losartan (COZAAR) 50 MG tablet Take 1 tablet by mouth daily.  Marland Kitchen. Lysine HCl 500 MG TABS Take by mouth 2 (two) times daily.  . Magnesium 250 MG TABS Take 1 tablet by mouth 2 (two) times daily.  . meclizine (ANTIVERT) 25 MG tablet Take 0.5-1 tablets by mouth Three times daily as needed.  . mometasone-formoterol (DULERA) 100-5 MCG/ACT AERO Inhale 2 puffs into the lungs 2 (two) times daily.  Marland Kitchen. morphine (MS CONTIN) 15 MG 12 hr tablet Take 1 tablet (15 mg total) by mouth every 12 (twelve) hours.  . Naftifine HCl (NAFTIN) 1 % GEL Apply topically as needed.   . nystatin (MYCOSTATIN) 100000 UNIT/ML suspension   . omeprazole (PRILOSEC) 20 MG capsule Take 20 mg by mouth 2 (two) times daily.   . potassium chloride (MICRO-K) 10 MEQ CR capsule Take 1 tablet by mouth.  . Spacer/Aero-Holding Chambers (AEROCHAMBER PLUS) inhaler Use as instructed  . traMADol (ULTRAM) 50 MG tablet Take 50 mg by mouth every 6 (six) hours as needed.  . trimethoprim (TRIMPEX) 100 MG tablet Take 1 tablet by mouth every morning. To help prevent UTI  . [DISCONTINUED] aspirin 81 MG tablet Take 81 mg by mouth daily.   . [DISCONTINUED] doxycycline (VIBRA-TABS) 100 MG tablet Take 1 tablet (100 mg total) by mouth 2 (two) times daily.  . [DISCONTINUED] predniSONE (DELTASONE) 10 MG tablet 40mg  daily for 3 days, then take 30mg  daily for 3 days, then take 20mg  daily for two days, then take 10mg  daily for 2 days

## 2013-12-11 NOTE — Addendum Note (Signed)
Addended by: Velvet BatheAULFIELD, Leanny Moeckel L on: 12/11/2013 12:38 PM   Modules accepted: Orders, Medications

## 2013-12-11 NOTE — Assessment & Plan Note (Signed)
She continues to use and benefit from her O2 at 2L at rest, 3L with exertion.

## 2013-12-11 NOTE — Patient Instructions (Signed)
Use your Elwin SleightDulera twice a day with the aerochamber 12 hours apart Use the duoneb as you are doing We will see you back in 6 months or sooner if needed

## 2013-12-19 ENCOUNTER — Telehealth: Payer: Self-pay | Admitting: Pulmonary Disease

## 2013-12-19 NOTE — Telephone Encounter (Signed)
I called and spoke with the pt and notified of recs per Dr. Kendrick FriesMcQuaid  She verbalized understanding and nothing further needed

## 2013-12-19 NOTE — Telephone Encounter (Signed)
Since she is not having cough or mucus production I'm not sure if this is a lung problem or not.  Please tell her to take the duoneb 4 times per day for a few days but if the problem worsens she needs to be seen by PCP or urgent care

## 2013-12-19 NOTE — Telephone Encounter (Signed)
Called and spoke to pt. Pt c/o chest tightness and heaviness x 2 days. Pt stated she is also having a slight increase in SOB. Pt denies significant cough, f/c/s or swelling. Pt last seen by BQ on 11/16.   BQ please advise.   Allergies  Allergen Reactions  . Aspirin     GI upset- SENSITIVITY ONLY PER PATIENT  . Bactrim     Severe stomach cramps and diarrhea  . Lactose Intolerance (Gi)   . Levetiracetam     Fatigue, onset 06/13/10  . Penicillins Itching  . Septra [Sulfamethoxazole-Trimethoprim]     Nausea and diarrhea  . Sulfa Antibiotics     Severe nausea  . Tape

## 2014-01-09 ENCOUNTER — Ambulatory Visit: Payer: Self-pay | Admitting: Oncology

## 2014-01-09 LAB — CBC CANCER CENTER
BASOS PCT: 0.3 %
Basophil #: 0 x10 3/mm (ref 0.0–0.1)
Eosinophil #: 0.1 x10 3/mm (ref 0.0–0.7)
Eosinophil %: 1.2 %
HCT: 36.4 % (ref 35.0–47.0)
HGB: 11.9 g/dL — ABNORMAL LOW (ref 12.0–16.0)
LYMPHS ABS: 1.3 x10 3/mm (ref 1.0–3.6)
LYMPHS PCT: 23.3 %
MCH: 31.9 pg (ref 26.0–34.0)
MCHC: 32.5 g/dL (ref 32.0–36.0)
MCV: 98 fL (ref 80–100)
MONO ABS: 0.7 x10 3/mm (ref 0.2–0.9)
MONOS PCT: 11.8 %
NEUTROS ABS: 3.7 x10 3/mm (ref 1.4–6.5)
NEUTROS PCT: 63.4 %
Platelet: 153 x10 3/mm (ref 150–440)
RBC: 3.72 10*6/uL — ABNORMAL LOW (ref 3.80–5.20)
RDW: 14.2 % (ref 11.5–14.5)
WBC: 5.8 x10 3/mm (ref 3.6–11.0)

## 2014-01-09 LAB — IRON AND TIBC
Iron Bind.Cap.(Total): 262 ug/dL (ref 250–450)
Iron Saturation: 29 %
Iron: 77 ug/dL (ref 50–170)
Unbound Iron-Bind.Cap.: 185 ug/dL

## 2014-01-09 LAB — FERRITIN: FERRITIN (ARMC): 221 ng/mL (ref 8–388)

## 2014-01-16 ENCOUNTER — Emergency Department: Payer: Self-pay | Admitting: Emergency Medicine

## 2014-01-16 LAB — COMPREHENSIVE METABOLIC PANEL
ALBUMIN: 3.7 g/dL (ref 3.4–5.0)
ALK PHOS: 71 U/L
ALT: 20 U/L
AST: 29 U/L (ref 15–37)
Anion Gap: 5 — ABNORMAL LOW (ref 7–16)
BUN: 39 mg/dL — ABNORMAL HIGH (ref 7–18)
Bilirubin,Total: 0.4 mg/dL (ref 0.2–1.0)
CO2: 33 mmol/L — AB (ref 21–32)
Calcium, Total: 8.7 mg/dL (ref 8.5–10.1)
Chloride: 95 mmol/L — ABNORMAL LOW (ref 98–107)
Creatinine: 1.19 mg/dL (ref 0.60–1.30)
EGFR (African American): 55 — ABNORMAL LOW
EGFR (Non-African Amer.): 45 — ABNORMAL LOW
Glucose: 127 mg/dL — ABNORMAL HIGH (ref 65–99)
OSMOLALITY: 277 (ref 275–301)
Potassium: 3.1 mmol/L — ABNORMAL LOW (ref 3.5–5.1)
Sodium: 133 mmol/L — ABNORMAL LOW (ref 136–145)
Total Protein: 7.4 g/dL (ref 6.4–8.2)

## 2014-01-16 LAB — CK TOTAL AND CKMB (NOT AT ARMC)
CK, TOTAL: 58 U/L (ref 26–192)
CK-MB: 1.8 ng/mL (ref 0.5–3.6)

## 2014-01-16 LAB — PROTIME-INR
INR: 1
Prothrombin Time: 13.2 secs (ref 11.5–14.7)

## 2014-01-16 LAB — CBC
HCT: 36.2 % (ref 35.0–47.0)
HGB: 11.7 g/dL — AB (ref 12.0–16.0)
MCH: 32.3 pg (ref 26.0–34.0)
MCHC: 32.4 g/dL (ref 32.0–36.0)
MCV: 100 fL (ref 80–100)
Platelet: 147 10*3/uL — ABNORMAL LOW (ref 150–440)
RBC: 3.63 10*6/uL — AB (ref 3.80–5.20)
RDW: 14 % (ref 11.5–14.5)
WBC: 5.9 10*3/uL (ref 3.6–11.0)

## 2014-01-16 LAB — PRO B NATRIURETIC PEPTIDE: B-TYPE NATIURETIC PEPTID: 229 pg/mL (ref 0–450)

## 2014-01-16 LAB — TROPONIN I
Troponin-I: <0.02 ng/mL
Troponin-I: <0.02 ng/mL

## 2014-01-16 LAB — APTT: Activated PTT: 31.8 secs (ref 23.6–35.9)

## 2014-01-17 ENCOUNTER — Emergency Department: Payer: Self-pay | Admitting: Emergency Medicine

## 2014-01-17 LAB — BASIC METABOLIC PANEL
Anion Gap: 10 (ref 7–16)
BUN: 27 mg/dL — ABNORMAL HIGH (ref 7–18)
CALCIUM: 8.6 mg/dL (ref 8.5–10.1)
CO2: 31 mmol/L (ref 21–32)
CREATININE: 1.04 mg/dL (ref 0.60–1.30)
Chloride: 91 mmol/L — ABNORMAL LOW (ref 98–107)
EGFR (Non-African Amer.): 53 — ABNORMAL LOW
Glucose: 135 mg/dL — ABNORMAL HIGH (ref 65–99)
Osmolality: 272 (ref 275–301)
Potassium: 3.8 mmol/L (ref 3.5–5.1)
SODIUM: 132 mmol/L — AB (ref 136–145)

## 2014-01-17 LAB — CBC
HCT: 37 % (ref 35.0–47.0)
HGB: 12.3 g/dL (ref 12.0–16.0)
MCH: 32.5 pg (ref 26.0–34.0)
MCHC: 33.2 g/dL (ref 32.0–36.0)
MCV: 98 fL (ref 80–100)
Platelet: 149 10*3/uL — ABNORMAL LOW (ref 150–440)
RBC: 3.78 10*6/uL — AB (ref 3.80–5.20)
RDW: 13.7 % (ref 11.5–14.5)
WBC: 10.4 10*3/uL (ref 3.6–11.0)

## 2014-01-17 LAB — TROPONIN I: Troponin-I: <0.02 ng/mL

## 2014-01-21 ENCOUNTER — Emergency Department: Payer: Self-pay | Admitting: Emergency Medicine

## 2014-01-21 LAB — BASIC METABOLIC PANEL
Anion Gap: 7 (ref 7–16)
BUN: 32 mg/dL — ABNORMAL HIGH (ref 7–18)
CREATININE: 1.13 mg/dL (ref 0.60–1.30)
Calcium, Total: 8.7 mg/dL (ref 8.5–10.1)
Chloride: 86 mmol/L — ABNORMAL LOW (ref 98–107)
Co2: 39 mmol/L — ABNORMAL HIGH (ref 21–32)
EGFR (African American): 58 — ABNORMAL LOW
GFR CALC NON AF AMER: 48 — AB
GLUCOSE: 122 mg/dL — AB (ref 65–99)
OSMOLALITY: 273 (ref 275–301)
Potassium: 3.9 mmol/L (ref 3.5–5.1)
SODIUM: 132 mmol/L — AB (ref 136–145)

## 2014-01-21 LAB — TROPONIN I: Troponin-I: <0.02 ng/mL

## 2014-01-21 LAB — CBC
HCT: 38.6 % (ref 35.0–47.0)
HGB: 13 g/dL (ref 12.0–16.0)
MCH: 32.6 pg (ref 26.0–34.0)
MCHC: 33.8 g/dL (ref 32.0–36.0)
MCV: 97 fL (ref 80–100)
PLATELETS: 182 10*3/uL (ref 150–440)
RBC: 4 10*6/uL (ref 3.80–5.20)
RDW: 13.6 % (ref 11.5–14.5)
WBC: 6.8 10*3/uL (ref 3.6–11.0)

## 2014-01-22 DIAGNOSIS — J449 Chronic obstructive pulmonary disease, unspecified: Secondary | ICD-10-CM

## 2014-01-22 DIAGNOSIS — J189 Pneumonia, unspecified organism: Secondary | ICD-10-CM

## 2014-01-22 DIAGNOSIS — F419 Anxiety disorder, unspecified: Secondary | ICD-10-CM

## 2014-01-22 DIAGNOSIS — M545 Low back pain: Secondary | ICD-10-CM

## 2014-01-23 ENCOUNTER — Ambulatory Visit: Payer: Medicare Other | Admitting: Internal Medicine

## 2014-01-26 ENCOUNTER — Ambulatory Visit: Payer: Self-pay | Admitting: Oncology

## 2014-01-28 ENCOUNTER — Inpatient Hospital Stay: Payer: Self-pay | Admitting: Specialist

## 2014-01-28 LAB — BASIC METABOLIC PANEL
Anion Gap: 8 (ref 7–16)
BUN: 31 mg/dL — AB (ref 7–18)
Calcium, Total: 8.8 mg/dL (ref 8.5–10.1)
Chloride: 82 mmol/L — ABNORMAL LOW (ref 98–107)
Co2: 34 mmol/L — ABNORMAL HIGH (ref 21–32)
Creatinine: 1.31 mg/dL — ABNORMAL HIGH (ref 0.60–1.30)
EGFR (African American): 49 — ABNORMAL LOW
EGFR (Non-African Amer.): 41 — ABNORMAL LOW
Glucose: 112 mg/dL — ABNORMAL HIGH (ref 65–99)
Osmolality: 257 (ref 275–301)
POTASSIUM: 4.3 mmol/L (ref 3.5–5.1)
SODIUM: 124 mmol/L — AB (ref 136–145)

## 2014-01-28 LAB — CBC
HCT: 34.9 % — ABNORMAL LOW (ref 35.0–47.0)
HGB: 11.3 g/dL — AB (ref 12.0–16.0)
MCH: 31.5 pg (ref 26.0–34.0)
MCHC: 32.4 g/dL (ref 32.0–36.0)
MCV: 98 fL (ref 80–100)
PLATELETS: 176 10*3/uL (ref 150–440)
RBC: 3.58 10*6/uL — ABNORMAL LOW (ref 3.80–5.20)
RDW: 13.7 % (ref 11.5–14.5)
WBC: 15.8 10*3/uL — AB (ref 3.6–11.0)

## 2014-01-28 LAB — TROPONIN I

## 2014-01-29 ENCOUNTER — Telehealth: Payer: Self-pay

## 2014-01-29 LAB — URINALYSIS, COMPLETE
BILIRUBIN, UR: NEGATIVE
Bacteria: NONE SEEN
Blood: NEGATIVE
Glucose,UR: NEGATIVE mg/dL (ref 0–75)
KETONE: NEGATIVE
Leukocyte Esterase: NEGATIVE
Nitrite: NEGATIVE
PROTEIN: NEGATIVE
Ph: 7 (ref 4.5–8.0)
Specific Gravity: 1.006 (ref 1.003–1.030)
Squamous Epithelial: <1

## 2014-01-29 LAB — CBC WITH DIFFERENTIAL/PLATELET
BASOS ABS: 0 10*3/uL (ref 0.0–0.1)
Basophil %: 0.1 %
EOS ABS: 0 10*3/uL (ref 0.0–0.7)
Eosinophil %: 0 %
HCT: 35.4 % (ref 35.0–47.0)
HGB: 11.7 g/dL — AB (ref 12.0–16.0)
Lymphocyte #: 0.3 10*3/uL — ABNORMAL LOW (ref 1.0–3.6)
Lymphocyte %: 2.6 %
MCH: 32.3 pg (ref 26.0–34.0)
MCHC: 32.9 g/dL (ref 32.0–36.0)
MCV: 98 fL (ref 80–100)
MONO ABS: 0.3 x10 3/mm (ref 0.2–0.9)
Monocyte %: 2.8 %
NEUTROS ABS: 10.6 10*3/uL — AB (ref 1.4–6.5)
NEUTROS PCT: 94.5 %
Platelet: 160 10*3/uL (ref 150–440)
RBC: 3.61 10*6/uL — ABNORMAL LOW (ref 3.80–5.20)
RDW: 13.3 % (ref 11.5–14.5)
WBC: 11.2 10*3/uL — ABNORMAL HIGH (ref 3.6–11.0)

## 2014-01-29 LAB — BASIC METABOLIC PANEL
Anion Gap: 7 (ref 7–16)
BUN: 23 mg/dL — ABNORMAL HIGH (ref 7–18)
CO2: 33 mmol/L — AB (ref 21–32)
Calcium, Total: 8.5 mg/dL (ref 8.5–10.1)
Chloride: 84 mmol/L — ABNORMAL LOW (ref 98–107)
Creatinine: 1.08 mg/dL (ref 0.60–1.30)
EGFR (African American): >60
GFR CALC NON AF AMER: 51 — AB
Glucose: 169 mg/dL — ABNORMAL HIGH (ref 65–99)
Osmolality: 257 (ref 275–301)
Potassium: 3.9 mmol/L (ref 3.5–5.1)
Sodium: 124 mmol/L — ABNORMAL LOW (ref 136–145)

## 2014-01-29 NOTE — Telephone Encounter (Signed)
PLEASE NOTE: All timestamps contained within this report are represented as [place] Standard Time. CONFIDENTIALTY NOTICE: This fax transmission is intended only for the addressee. It contains information that is legally privileged, confidential or otherwise protected from use or disclosure. If you are not the intended recipient, you are strictly prohibited from reviewing, disclosing, copying using or disseminating any of this information or taking any action in reliance on or regarding this information. If you have received this fax in error, please notify us immediately by telephone so that we can arrange for its return to [place]. Phone: [phone], Toll-Free: [phone], Fax: [phone] Page: 1 of 1 Call Id: [id number] [facility] Night - Client TELEPHONE ADVICE RECORD [facility] Medical Call Center Patient Name: [name] Gender: Female DOB: Sep 21, 1921 Age: 81 Y 6 M 12 D Return Phone Number: Address: City/State/Zip: [place] [facility] Night - Client Client Site [facility] - Night Physician [facility] Contact Type Call Call Type Page Only Caller Name Massie Bougie from [facility] [phone] Relationship To Patient Care Giver Is this call to report lab results? No Return Phone Number Unavailable Initial Comment ( 1st Attempt ) Caller states pt short of breath, had to turn oxygen up to 4 liters, her sat is 89-90% on 4 liters, coughing Nurse Assessment Guidelines Guideline Title Affirmed Question Affirmed Notes Nurse Date/Time (Eastern Time) Disp. Time Lamount Cohen Time) Disposition Final User 01/28/2014 4:54:16 PM Send to [facility] Paging Queue Claudina Lick 01/28/2014 4:59:21 PM Paged On Call back to Call Center - PC [place], [facility] 01/28/2014 4:59:31 PM Paged On Call back to Call Center - PC Charmian Muff, [facility] 01/28/2014 4:59:42 PM Paged On Call back to Call Center - PC [place], [facility] 01/28/2014 5:08:40 PM  Page Completed Yes [place] [facility] Instructions Given Call Event Type User Date / Time Description Paging DoctorName DoctorPhone DateTime Result/Outcome Notes Santiago Bumpers [phone] 01/28/2014 4:59:31 PM Called On Call Provider - Left Message ( 1st Attempt ) Left voice mail Santiago Bumpers 01/28/2014 5:08:33 PM Spoke with On Call - General Provided on-call with page information, provided call back phone number. On-call states will call the caller back.

## 2014-01-29 NOTE — Telephone Encounter (Signed)
Was sent to ER and admitted

## 2014-01-30 LAB — BASIC METABOLIC PANEL
ANION GAP: 7 (ref 7–16)
BUN: 18 mg/dL (ref 7–18)
CO2: 32 mmol/L (ref 21–32)
Calcium, Total: 7.6 mg/dL — ABNORMAL LOW (ref 8.5–10.1)
Chloride: 87 mmol/L — ABNORMAL LOW (ref 98–107)
Creatinine: 1.03 mg/dL (ref 0.60–1.30)
EGFR (African American): >60
GFR CALC NON AF AMER: 54 — AB
Glucose: 157 mg/dL — ABNORMAL HIGH (ref 65–99)
Osmolality: 259 (ref 275–301)
Potassium: 4.4 mmol/L (ref 3.5–5.1)
SODIUM: 126 mmol/L — AB (ref 136–145)

## 2014-01-30 LAB — EXPECTORATED SPUTUM ASSESSMENT W REFEX TO RESP CULTURE

## 2014-01-31 LAB — SODIUM: Sodium: 132 mmol/L — ABNORMAL LOW (ref 136–145)

## 2014-02-01 ENCOUNTER — Ambulatory Visit: Payer: Medicare Other | Admitting: Podiatry

## 2014-02-01 LAB — SODIUM: Sodium: 129 mmol/L — ABNORMAL LOW (ref 136–145)

## 2014-02-02 DIAGNOSIS — I5030 Unspecified diastolic (congestive) heart failure: Secondary | ICD-10-CM

## 2014-02-02 DIAGNOSIS — J189 Pneumonia, unspecified organism: Secondary | ICD-10-CM

## 2014-02-02 DIAGNOSIS — J449 Chronic obstructive pulmonary disease, unspecified: Secondary | ICD-10-CM

## 2014-02-02 DIAGNOSIS — R911 Solitary pulmonary nodule: Secondary | ICD-10-CM

## 2014-02-02 DIAGNOSIS — J9610 Chronic respiratory failure, unspecified whether with hypoxia or hypercapnia: Secondary | ICD-10-CM

## 2014-02-02 DIAGNOSIS — R197 Diarrhea, unspecified: Secondary | ICD-10-CM

## 2014-02-03 LAB — CULTURE, BLOOD (SINGLE)

## 2014-02-04 ENCOUNTER — Emergency Department: Payer: Self-pay | Admitting: Internal Medicine

## 2014-02-04 LAB — CBC
HCT: 37.9 % (ref 35.0–47.0)
HGB: 12.1 g/dL (ref 12.0–16.0)
MCH: 31.5 pg (ref 26.0–34.0)
MCHC: 32.1 g/dL (ref 32.0–36.0)
MCV: 98 fL (ref 80–100)
PLATELETS: 204 10*3/uL (ref 150–440)
RBC: 3.85 10*6/uL (ref 3.80–5.20)
RDW: 13.7 % (ref 11.5–14.5)
WBC: 14.4 10*3/uL — AB (ref 3.6–11.0)

## 2014-02-04 LAB — BASIC METABOLIC PANEL
Anion Gap: 6 — ABNORMAL LOW (ref 7–16)
BUN: 24 mg/dL — ABNORMAL HIGH (ref 7–18)
CALCIUM: 9 mg/dL (ref 8.5–10.1)
CREATININE: 1.13 mg/dL (ref 0.60–1.30)
Chloride: 87 mmol/L — ABNORMAL LOW (ref 98–107)
Co2: 39 mmol/L — ABNORMAL HIGH (ref 21–32)
EGFR (African American): 58 — ABNORMAL LOW
EGFR (Non-African Amer.): 48 — ABNORMAL LOW
GLUCOSE: 149 mg/dL — AB (ref 65–99)
Osmolality: 271 (ref 275–301)
POTASSIUM: 4.2 mmol/L (ref 3.5–5.1)
SODIUM: 132 mmol/L — AB (ref 136–145)

## 2014-02-04 LAB — TROPONIN I
Troponin-I: 0.04 ng/mL
Troponin-I: 0.04 ng/mL

## 2014-02-04 LAB — PROTIME-INR
INR: 0.9
PROTHROMBIN TIME: 11.8 s (ref 11.5–14.7)

## 2014-02-04 LAB — PRO B NATRIURETIC PEPTIDE: B-TYPE NATIURETIC PEPTID: 1487 pg/mL — AB (ref 0–450)

## 2014-02-05 DIAGNOSIS — I209 Angina pectoris, unspecified: Secondary | ICD-10-CM

## 2014-02-12 DIAGNOSIS — I25119 Atherosclerotic heart disease of native coronary artery with unspecified angina pectoris: Secondary | ICD-10-CM

## 2014-02-12 DIAGNOSIS — I2510 Atherosclerotic heart disease of native coronary artery without angina pectoris: Secondary | ICD-10-CM

## 2014-02-14 DIAGNOSIS — R05 Cough: Secondary | ICD-10-CM

## 2014-02-14 DIAGNOSIS — J449 Chronic obstructive pulmonary disease, unspecified: Secondary | ICD-10-CM

## 2014-02-19 DIAGNOSIS — J209 Acute bronchitis, unspecified: Secondary | ICD-10-CM

## 2014-02-26 ENCOUNTER — Ambulatory Visit: Payer: Self-pay | Admitting: Oncology

## 2014-03-06 DIAGNOSIS — J441 Chronic obstructive pulmonary disease with (acute) exacerbation: Secondary | ICD-10-CM

## 2014-03-06 DIAGNOSIS — Z9981 Dependence on supplemental oxygen: Secondary | ICD-10-CM | POA: Diagnosis not present

## 2014-03-06 DIAGNOSIS — J9610 Chronic respiratory failure, unspecified whether with hypoxia or hypercapnia: Secondary | ICD-10-CM | POA: Diagnosis not present

## 2014-03-06 DIAGNOSIS — R918 Other nonspecific abnormal finding of lung field: Secondary | ICD-10-CM | POA: Diagnosis not present

## 2014-03-08 DIAGNOSIS — R109 Unspecified abdominal pain: Secondary | ICD-10-CM | POA: Diagnosis not present

## 2014-04-01 DIAGNOSIS — R05 Cough: Secondary | ICD-10-CM

## 2014-04-01 DIAGNOSIS — F419 Anxiety disorder, unspecified: Secondary | ICD-10-CM

## 2014-04-01 DIAGNOSIS — R0602 Shortness of breath: Secondary | ICD-10-CM | POA: Diagnosis not present

## 2014-04-06 DIAGNOSIS — K219 Gastro-esophageal reflux disease without esophagitis: Secondary | ICD-10-CM

## 2014-04-06 DIAGNOSIS — I4891 Unspecified atrial fibrillation: Secondary | ICD-10-CM

## 2014-04-06 DIAGNOSIS — I2510 Atherosclerotic heart disease of native coronary artery without angina pectoris: Secondary | ICD-10-CM

## 2014-04-06 DIAGNOSIS — I5020 Unspecified systolic (congestive) heart failure: Secondary | ICD-10-CM

## 2014-04-06 DIAGNOSIS — G250 Essential tremor: Secondary | ICD-10-CM

## 2014-04-06 DIAGNOSIS — B351 Tinea unguium: Secondary | ICD-10-CM | POA: Diagnosis not present

## 2014-04-06 DIAGNOSIS — J449 Chronic obstructive pulmonary disease, unspecified: Secondary | ICD-10-CM

## 2014-04-06 DIAGNOSIS — I10 Essential (primary) hypertension: Secondary | ICD-10-CM | POA: Diagnosis not present

## 2014-04-06 DIAGNOSIS — M545 Low back pain: Secondary | ICD-10-CM

## 2014-04-08 ENCOUNTER — Emergency Department: Payer: Self-pay | Admitting: Emergency Medicine

## 2014-04-13 DIAGNOSIS — R0602 Shortness of breath: Secondary | ICD-10-CM | POA: Diagnosis not present

## 2014-04-13 DIAGNOSIS — F419 Anxiety disorder, unspecified: Secondary | ICD-10-CM

## 2014-04-13 DIAGNOSIS — K219 Gastro-esophageal reflux disease without esophagitis: Secondary | ICD-10-CM | POA: Diagnosis not present

## 2014-04-13 DIAGNOSIS — J449 Chronic obstructive pulmonary disease, unspecified: Secondary | ICD-10-CM | POA: Diagnosis not present

## 2014-04-26 LAB — CBC
HCT: 32.3 % — ABNORMAL LOW (ref 35.0–47.0)
HGB: 10.6 g/dL — AB (ref 12.0–16.0)
MCH: 32.5 pg (ref 26.0–34.0)
MCHC: 33 g/dL (ref 32.0–36.0)
MCV: 99 fL (ref 80–100)
Platelet: 201 10*3/uL (ref 150–440)
RBC: 3.27 10*6/uL — AB (ref 3.80–5.20)
RDW: 14.7 % — ABNORMAL HIGH (ref 11.5–14.5)
WBC: 7.2 10*3/uL (ref 3.6–11.0)

## 2014-04-26 LAB — BASIC METABOLIC PANEL
ANION GAP: 9 (ref 7–16)
BUN: 20 mg/dL
CALCIUM: 8.8 mg/dL — AB
CHLORIDE: 87 mmol/L — AB
Co2: 35 mmol/L — ABNORMAL HIGH
Creatinine: 0.98 mg/dL
EGFR (African American): 59 — ABNORMAL LOW
EGFR (Non-African Amer.): 51 — ABNORMAL LOW
GLUCOSE: 148 mg/dL — AB
Potassium: 3.2 mmol/L — ABNORMAL LOW
SODIUM: 131 mmol/L — AB

## 2014-04-26 LAB — TROPONIN I: Troponin-I: <0.03 ng/mL

## 2014-04-26 LAB — PRO B NATRIURETIC PEPTIDE: B-TYPE NATIURETIC PEPTID: 188 pg/mL — AB

## 2014-04-26 LAB — CK TOTAL AND CKMB (NOT AT ARMC)
CK, TOTAL: 17 U/L — AB
CK-MB: 0.8 ng/mL

## 2014-04-27 ENCOUNTER — Inpatient Hospital Stay
Admit: 2014-04-27 | Disposition: A | Discharge status: Skilled Nursing Facility | Payer: Self-pay | Attending: Internal Medicine | Admitting: Internal Medicine

## 2014-04-27 ENCOUNTER — Ambulatory Visit
Admit: 2014-04-27 | Disposition: A | Discharge status: Home / Self Care | Payer: Self-pay | Attending: Family Medicine | Admitting: Family Medicine

## 2014-04-28 LAB — BASIC METABOLIC PANEL
Anion Gap: 7 (ref 7–16)
BUN: 16 mg/dL
CHLORIDE: 90 mmol/L — AB
CO2: 38 mmol/L — AB
Calcium, Total: 8.8 mg/dL — ABNORMAL LOW
Creatinine: 0.81 mg/dL
EGFR (African American): >60
EGFR (Non-African Amer.): >60
GLUCOSE: 112 mg/dL — AB
POTASSIUM: 3 mmol/L — AB
SODIUM: 135 mmol/L

## 2014-04-29 LAB — BASIC METABOLIC PANEL
Anion Gap: 6 — ABNORMAL LOW (ref 7–16)
BUN: 14 mg/dL
CREATININE: 0.84 mg/dL
Calcium, Total: 8.5 mg/dL — ABNORMAL LOW
Chloride: 90 mmol/L — ABNORMAL LOW
Co2: 36 mmol/L — ABNORMAL HIGH
EGFR (African American): >60
EGFR (Non-African Amer.): >60
Glucose: 112 mg/dL — ABNORMAL HIGH
Potassium: 2.9 mmol/L — ABNORMAL LOW
Sodium: 132 mmol/L — ABNORMAL LOW

## 2014-04-29 LAB — MAGNESIUM: MAGNESIUM: 1.7 mg/dL

## 2014-04-30 LAB — BASIC METABOLIC PANEL
ANION GAP: 11 (ref 7–16)
BUN: 25 mg/dL — AB
CALCIUM: 8.6 mg/dL — AB
Chloride: 92 mmol/L — ABNORMAL LOW
Co2: 33 mmol/L — ABNORMAL HIGH
Creatinine: 1.09 mg/dL — ABNORMAL HIGH
EGFR (African American): 52 — ABNORMAL LOW
EGFR (Non-African Amer.): 45 — ABNORMAL LOW
Glucose: 114 mg/dL — ABNORMAL HIGH
Potassium: 3.1 mmol/L — ABNORMAL LOW
SODIUM: 136 mmol/L

## 2014-04-30 LAB — TROPONIN I
Troponin-I: 0.08 ng/mL — ABNORMAL HIGH
Troponin-I: 0.11 ng/mL — ABNORMAL HIGH
Troponin-I: 0.08 ng/mL — ABNORMAL HIGH

## 2014-05-01 LAB — BASIC METABOLIC PANEL
Anion Gap: 10 (ref 7–16)
BUN: 30 mg/dL — ABNORMAL HIGH
CALCIUM: 8.3 mg/dL — AB
CREATININE: 0.95 mg/dL
Chloride: 89 mmol/L — ABNORMAL LOW
Co2: 35 mmol/L — ABNORMAL HIGH
EGFR (African American): >60
GFR CALC NON AF AMER: 53 — AB
Glucose: 150 mg/dL — ABNORMAL HIGH
POTASSIUM: 3 mmol/L — AB
SODIUM: 134 mmol/L — AB

## 2014-05-01 LAB — VANCOMYCIN, TROUGH
Vancomycin, Trough: 14 ug/mL
Vancomycin, Trough: 14 ug/mL

## 2014-05-02 LAB — BASIC METABOLIC PANEL
Anion Gap: 7 (ref 7–16)
BUN: 29 mg/dL — ABNORMAL HIGH
CALCIUM: 8.3 mg/dL — AB
CO2: 33 mmol/L — AB
CREATININE: 0.99 mg/dL
Chloride: 96 mmol/L — ABNORMAL LOW
EGFR (Non-African Amer.): 50 — ABNORMAL LOW
GFR CALC AF AMER: 58 — AB
Glucose: 154 mg/dL — ABNORMAL HIGH
Potassium: 4.3 mmol/L
Sodium: 136 mmol/L

## 2014-05-02 LAB — CULTURE, BLOOD (SINGLE)

## 2014-05-03 DIAGNOSIS — J449 Chronic obstructive pulmonary disease, unspecified: Secondary | ICD-10-CM | POA: Diagnosis not present

## 2014-05-03 DIAGNOSIS — I25119 Atherosclerotic heart disease of native coronary artery with unspecified angina pectoris: Secondary | ICD-10-CM | POA: Diagnosis not present

## 2014-05-03 DIAGNOSIS — J181 Lobar pneumonia, unspecified organism: Secondary | ICD-10-CM | POA: Diagnosis not present

## 2014-05-03 DIAGNOSIS — I5032 Chronic diastolic (congestive) heart failure: Secondary | ICD-10-CM | POA: Diagnosis not present

## 2014-05-07 DIAGNOSIS — H6120 Impacted cerumen, unspecified ear: Secondary | ICD-10-CM

## 2014-05-07 DIAGNOSIS — H04129 Dry eye syndrome of unspecified lacrimal gland: Secondary | ICD-10-CM | POA: Diagnosis not present

## 2014-05-19 NOTE — Consult Note (Signed)
PATIENT NAME:  [name], [name] MR#:  [id number] DATE OF BIRTH:  [birth year]  DATE OF CONSULTATION:  09/05/2013  REFERRING PHYSICIAN:  Dr. Margarita GrizzleWoodruff. CONSULTING PHYSICIAN:  Lamar BlinksBruce J. Macallan Ord, MD  REASON FOR CONSULTATION: Angina with coronary artery disease, hypertension, hyperlipidemia, and previous abnormal EKG.   CHIEF COMPLAINT: Chest pain.   HISTORY OF PRESENT ILLNESS: This is a 81 year old female with known coronary artery disease, status post recent hospitalization for angina and minimal elevation of troponin with hypertension and hyperlipidemia on appropriate medication management including isosorbide and beta blocker. The patient has had an episode for which the patient has had some stressors at home, for which she had some chest discomfort, substernal in nature, radiating into her arm and her back, lasting for a 3-5 minute period and she took nitroglycerin at that time.  The patient did have some improvements, but slow improvements. There were concerns and she was taken to the Emergency Room with normal troponins and  EKG showing normal sinus rhythm with septal infarct, age undetermined, but no other acute myocardial infarction. The patient had normal blood pressure and heart rate during that period of time and was placed on appropriate continued medication management as before. Now she is comfortable and wishes to continue to use medication management and not be admitted.  The patient, therefore, may be ambulated and followed for further symptoms.   REVIEW OF SYSTEMS: The remainder of review of systems not easy to assess due to the patient's somewhat somnolent.   PAST MEDICAL HISTORY:  1.  Coronary disease.  2.  Hypertension.  3.  Hyperlipidemia.   FAMILY HISTORY: No family members with early onset of cardiovascular disease or hypertension.   SOCIAL HISTORY: Currently denies alcohol or tobacco use.   ALLERGIES: AS LISTED.   MEDICATIONS: As listed.   PHYSICAL EXAMINATION:  VITAL  SIGNS: Blood pressure is 110 upright, reclining, and regular.  GENERAL: She is a well-appearing female in no acute distress.  HEENT: No icterus, thyromegaly, ulcers, hemorrhage, or xanthelasma.  CARDIOVASCULAR: Regular rate and rhythm. Normal S1 and S2. A 2/6 apical murmur consistent with mitral regurgitation. PMI is inferiorly displaced. Carotid upstroke normal without bruit. Jugular venous pressure is normal.  LUNGS: Clear to auscultation with normal respirations.  ABDOMEN: Soft, nontender, without hepatosplenomegaly or masses. Abdominal aorta is normal size without bruit.  EXTREMITIES: Two plus radial, femoral, dorsal pedal pulses, with no lower extremity edema, cyanosis, clubbing, or ulcers.  NEUROLOGIC: She is not oriented at this time.   ASSESSMENT: A 81 year old female with hypertension, hyperlipidemia, and coronary artery disease with stable angina, progressive at this time, but more stable and no current evidence of myocardial infarction or heart failure.   RECOMMENDATIONS:  1.  Increase isosorbide to 60 mg and give patient a dose today.  2.  Continue another beta blocker for antianginal, if able.  3.  Begin ambulation and follow for any further significant symptoms.  4.  Possible discharge to home with follow-up in 2 days for further evaluation of adjustments of medications.    ____________________________ Lamar BlinksBruce J. Elgene Coral, MD bjk:ts D: 09/05/2013 10:56:53 ET T: 09/05/2013 12:05:40 ET JOB#: [id number]  cc: Lamar BlinksBruce J. Samira Acero, MD, <Dictator> Lamar BlinksBRUCE J Money Mckeithan MD ELECTRONICALLY SIGNED 09/06/2013 10:19

## 2014-05-19 NOTE — Consult Note (Signed)
Present Illness Patient is an 81 year old female with history of chronic pulmonary heart disease, history of peripheral vascular disease with a prior TIA as well as hypertension and hyperlipidemia.  She had been treated with nebivolol for her blood pressure.  She developed rather acute  onset of shortness of breath over the last several days correlating with more severe peripheral edema.  She presented to the emergency room where chest x-ray revealed mild pulmonary edema.  Her cardiac markers were checked and showed an elevated serum troponin of 1.9 with please serum CPKMB of 10.3.  She had some midsternal burning.  This is not currently present.  Electrocardiogram reveals sinus rhythm with left bundle-branch block.  She ambulates fairly well with a walker although was limited quite a bit by back pain.  She currently is resting comfortably in bed with no complaints.  She has been placed on AV heparin and taken off of nebivolol and placed on metoprolol.  She had been on Inderal in the past which dramatically improved her tremors.  She has been on Bystolic recently which has allowed her tremors to reoccur.   Physical Exam:  GEN no acute distress   HEENT hearing intact to voice, moist oral mucosa   NECK No masses   RESP normal resp effort  no use of accessory muscles  rhonchi   CARD Regular rate and rhythm  No murmur   ABD denies tenderness  no liver/spleen enlargement  no Abdominal Bruits  no Adominal Mass   LYMPH negative neck, negative axillae   EXTR negative cyanosis/clubbing, negative edema   SKIN normal to palpation   NEURO cranial nerves intact, motor/sensory function intact   PSYCH A+O to time, place, person   Review of Systems:  Subjective/Chief Complaint Shortness of breath and burning midsternal   General: Fatigue   Skin: No Complaints   ENT: No Complaints   Eyes: No Complaints   Neck: No Complaints   Respiratory: Short of breath   Cardiovascular: midsternal chest  burning on presentation   Gastrointestinal: No Complaints   Genitourinary: No Complaints   Vascular: No Complaints   Musculoskeletal: No Complaints   Neurologic: No Complaints   Hematologic: No Complaints   Endocrine: No Complaints   Psychiatric: No Complaints   Review of Systems: All other systems were reviewed and found to be negative   Medications/Allergies Reviewed Medications/Allergies reviewed   Family & Social History:  Family and Social History:  Family History Non-Contributory  TIA   Social History negative tobacco, negative ETOH   Place of Living Nursing Home  [facility]   EKG:  EKG NSR   Abnormal LBBB    PCN: Itching  Lactose: Unknown  Tape: Unknown  Bactrim: Rash   Impression 81 year old female with history of reactive airway disease hypertension and hyperlipidemia who was admitted with progressive shortness of breath, peripheral edema and abnormal cardiac markers.  Patient did have some burning in her chest on presentation.  She has no known coronary disease.  She does have renal vascular disease by her report treated invasively approximately 20-30 years ago.  She denies any further symptoms at present.  She has a mild serum troponin elevation.  She is currently resting comfortably with no shortness of breath or chest pain.  Certainly the etiology of her elevated troponin could be coronary disease versus demand ischemia.  Will proceed with an echocardiogram to evaluate left ventricular function and wall motion as well as valvular disease.  Based on this will make decision regarding  invasive evaluation versus empiric medical therapy.  Will discontinue and nebivolol and metoprolol and place on Inderal at her request.   she had marked improvement in her tremor when on Inderal in the past.  The will attempt to control her possible anginal symptoms as well as tremor with a single drug.  Would also consider adding long-acting nitrates as blood pressure tolerates.   Should she improved with this regimen, would attempt medical therapy before proceeding to invasive evaluation given advanced age and relative increased risk he had a cardiac catheterization.  I discussed this at length with the patient and multiple family members.   Plan 1. Will discontinue metoprolol and place on Inderal 10 mg b.i.d. 2. Consider discontinuing Ameliride-hydrochlorothiazide and place on long-acting nitrates for antianginal control 3. Will review echocardiogram when available 4. Ambulate on this medication regimen change and proceed with discharge back to [facility] if stable. 5. Would defer any invasive evaluation to failure of medical therapy.   Electronic Signatures: Dalia HeadingFath, Kenneth A (MD)  (Signed 18-Jun-15 14:06)  Authored: General Aspect/Present Illness, History and Physical Exam, Review of System, Family & Social History, EKG , Allergies, Impression/Plan   Last Updated: 18-Jun-15 14:06 by Dalia HeadingFath, Kenneth A (MD)

## 2014-05-19 NOTE — H&P (Signed)
PATIENT NAME:  [name], FUHRIMAN MR#:  [id number] DATE OF BIRTH:  1921-06-11  DATE OF ADMISSION:  07/13/2013  REFERRING PHYSICIAN:  Dr. Manson Passey.    PRIMARY CARE PHYSICIAN:  Dr. Burnett Sheng.   CHIEF COMPLAINT:  Shortness of breath.   HISTORY OF PRESENT ILLNESS:  An 81 year old Caucasian female with past medical history of COPD with chronic respiratory failure requiring 2 to 3 liters nasal cannula at baseline as well as hypertension, presenting with shortness of breath.  Describes intermittent shortness of breath for approximately one week duration, however gradually worsening with the worst episode today.  Shortness of shortness of breath described mainly as dyspnea on exertion.  Denies any cough, fevers or chills.  She has associated lower extremity edema; however, denies any orthopnea.  Throughout the week she has had associated chest pain, retrosternal in location, burning in quality, 4 to 5 out of 10 in intensity, radiating to the back.  No worsening or relieving factors.  She also denotes having decreased mobility secondary to low back pain over the last about six weeks.  She uses a cane and walker at baseline, however her back pain has been more predominant making it difficult for her to ambulate even despite her assistive devices.   REVIEW OF SYSTEMS:  CONSTITUTIONAL:  Positive for fatigue, weakness.  Denies fevers or chills.  EYES:  Denied blurred vision, double vision, or eye pain.  EARS, NOSE, THROAT:  Denies tinnitus, ear pain, hearing loss.  RESPIRATORY:  Denies cough or wheeze.  Positive for shortness of breath as described above.  CARDIOVASCULAR:  Positive for chest pain as described above as well as lower extremity edema.  Denies any orthopnea or palpitations.  GASTROINTESTINAL:  Denies nausea, vomiting, diarrhea, abdominal pain.  GENITOURINARY:  Denies dysuria, hematuria.  ENDOCRINE:  Denies nocturia or thyroid problems.  HEMATOLOGIC AND LYMPHATIC:  Denies easy bruising or bleeding.   SKIN:  Denies rashes or lesions.  MUSCULOSKELETAL:  Positive for back pain as described above.  Denies any neck pain, shoulders, knees or hip pain, arthritic symptoms.  NEUROLOGIC:  Denies any paralysis, paresthesias.  PSYCHIATRIC:  Denies anxiety or depressive symptoms.  Otherwise, full review of systems performed by me is negative.   PAST MEDICAL HISTORY:  COPD with chronic respiratory failure requiring 2 to 3 liters nasal cannula at baseline, paroxysmal atrial fibrillation, anxiety, depression, not otherwise specified, hypertension, essential tremor and disk herniation of her lower back.   SOCIAL HISTORY:  Remote tobacco usage.  Denies any alcohol or drug usage.  Has a cane and walker for ambulation.   FAMILY HISTORY:  Positive for hypertension.   ALLERGIES:  BACTRIM, PENICILLIN, LACTOSE AND TAPE.   HOME MEDICATIONS:  Include aspirin 81 mg by mouth twice daily, losartan 50 mg by mouth daily, amiloride/hydrochlorothiazide 5/50 mg by mouth daily, DuoNeb treatments 3 mL 4 times daily as needed for shortness of breath, Dulera 5/100 mcg inhalation 2 puffs twice daily, Prilosec 20 mg by mouth twice daily, Premarin 0.3 mg by mouth 3 times weekly, oxygen 2 to 3 liters nasal cannula chronically.   PHYSICAL EXAMINATION: VITAL SIGNS:  Temperature 97.8, heart rate 93, respirations 22, blood pressure 164/77, saturating 99% on supplemental O2.  Weight 65.8 kg, BMI 25.7.  GENERAL:  Well-nourished, well-developed, Caucasian female, currently in no acute distress.  HEAD:  Normocephalic, atraumatic.  EYES:  Pupils equal, round, reactive to light.  Extraocular muscles intact.  No scleral icterus.  MOUTH:  Moist mucosal membrane.  Dentition intact.  No abscess noted.  EAR, NOSE, THROAT:  Clear without exudates.  No external lesions.  NECK:  Supple.  No thyromegaly.  No nodules.  No JVD.  PULMONARY:  Diminished breath sounds throughout all lung fields without wheezes, rales, or rhonchi.  No use of accessory  muscles.  Good respiratory effort.  CHEST:  Nontender to palpation.  CARDIOVASCULAR:  S1, S2, regular rate and rhythm.  No murmurs, rubs, or gallops.  1 to 2+ edema bilaterally, most prominent in the left lower extremity.  Pedal pulses 2+ bilaterally.  GASTROINTESTINAL:  Soft, nontender, nondistended.  No masses.  Positive bowel sounds.  No hepatosplenomegaly.  MUSCULOSKELETAL:  No swelling, clubbing.  Positive for edema as described above.  Range of motion full in all extremities.  No palpable tenderness over the spine or paraspinal regions.  NEUROLOGIC:  Cranial nerves II through XII intact.  No gross focal neurological deficits.  Sensation intact.  Reflexes intact.  SKIN:  No ulcerations, lesions, rashes, or cyanosis.  Has erythema of lower extremities bilaterally likely from chronic edema.  Skin is warm and dry.  Turgor intact.  PSYCHIATRIC:  Mood and affect within normal limits.  The patient awake, alert, oriented x 3.  Insight and judgment intact.   LABORATORY DATA:  EKG reveals left bundle branch block, heart rate 94.  Chest x-ray performed:  Mild vascular congestion concerning for mild pulmonary edema.  Remainder of laboratory data:  Sodium 130, potassium 3.7, chloride 94, bicarb 32, BUN 29, creatinine 0.88, glucose of 104.  Troponin I less than 0.02.  WBC 6.9, hemoglobin 10.6, platelets of 260.  D-dimer 808.   ASSESSMENT AND PLAN:  An 81 year old Caucasian female with history of chronic respiratory failure presenting with shortness of breath.  1.  Shortness of breath, multifactorial given history of immobility, lower extremity edema as well as d-dimer.  We will check a CT now to rule out pulmonary embolism.  If positive, we will initiate heparin drip.  In the meantime, add DuoNeb treatments, incentive spirometry.  [facility].  2.  Lumbago.  Physical therapy evaluation as well as provide better pain control and add a bowel regimen.  She will likely require rehabilitation.  3.  Chest  pain.  Sounds unlikely to be vascular in etiology; however, we will trend cardiac enzymes x 3.  She has already been on aspirin.  We will place on telemetry.  4.  Venous thromboembolism prophylaxis with heparin subQ.  5.  CODE STATUS:  THE PATIENT IS DO NOT RESUSCITATE.   TIME SPENT:  45 minutes.    ____________________________ Cletis Athensavid K. Hower, MD dkh:ea D: 07/13/2013 01:11:10 ET T: 07/13/2013 02:11:22 ET JOB#: [id number]  cc: Cletis Athensavid K. Hower, MD, <Dictator> DAVID Synetta ShadowK HOWER MD ELECTRONICALLY SIGNED 07/13/2013 20:26

## 2014-05-19 NOTE — Discharge Summary (Signed)
Dates of Admission and Diagnosis:  Date of Admission 13-Jul-2013   Date of Discharge 20-Jul-2013   Admitting Diagnosis Shortness of breath   Final Diagnosis 1. Acute Myocardial Infarction (Heart Attack) (NSTEMI) 2. Chronic respiratory failure, chronic obstructive pulmonary disease on home oxygen (2-3 liters) 3.hx afib: on aspirin for anticoagulation 4. hyponatremia: resolved 5. Acute bronchitis 6. urinary retention: foley placed, will need voiding trial in 24-48 hrs    Chief Complaint/History of Present Illness An 81 year old Caucasian female with past medical history of COPD with chronic respiratory failure requiring 2 to 3 liters nasal cannula at baseline as well as hypertension, presenting with shortness of breath.  Describes intermittent shortness of breath for approximately one week duration, however gradually worsening with the worst episode today.  Shortness of shortness of breath described mainly as dyspnea on exertion.  Denies any cough, fevers or chills.  She has associated lower extremity edema; however, denies any orthopnea.  Throughout the week she has had associated chest pain, retrosternal in location, burning in quality, 4 to 5 out of 10 in intensity, radiating to the back.  No worsening or relieving factors.  She also denotes having decreased mobility secondary to low back pain over the last about six weeks.  She uses a cane and walker at baseline, however her back pain has been more predominant making it difficult for her to ambulate even despite her assistive devices.   Allergies:  PCN: Itching  Lactose: Unknown  Tape: Unknown  Bactrim: Rash  Cardiology:  17-Jun-15 22:17   Ventricular Rate 94  Atrial Rate 94  P-R Interval 190  QRS Duration 126  QT 402  QTc 502  P Axis 70  R Axis 58  T Axis 72  ECG interpretation Normal sinus rhythm Possible Left atrial enlargement Left bundle branch block Abnormal ECG When compared with ECG of 30-Apr-2013 13:56, Left bundle  branch block is now Present ----------unconfirmed---------- Confirmed by OVERREAD, NOT ([id number]), editor PEARSON, BARBARA ([id number]) on 07/13/2013 10:30:13 AM  18-Jun-15 13:36   Echo Doppler REASON FOR EXAM:     COMMENTS:     PROCEDURE: Page - ECHO DOPPLER COMPLETE(TRANSTHOR)  - Jul 13 2013  1:36PM   RESULT: Echocardiogram Report  Patient Name:   [name] Date of Exam: 07/13/2013 Medical Rec #:  [id number]             Custom1: Date of Birth:  Oct 16, 1921          Height:       63.0 in Patient Age:    45 years           Weight:       143.0 lb Patient Gender: F                  BSA:          1.68 m??  Indications: MI Sonographer:    Sherrie Sport RDCS Referring Phys: Valentino Nose, K  Sonographer Comments: Technically challenging study due to less than  ideal echo windows and no parasternal window.  Summary:  1. Left ventricular ejection fraction, by visual estimation, is not able  to assess due to poor windows. APpears to be probably mildly decreased.%.  2. Decreased left ventricular internal cavity size.  3. Mild mitral valve regurgitation.  4. Mildly increased left ventricular posterior wall thickness. 2D AND M-MODE MEASUREMENTS (normal ranges within parentheses): Left Ventricle:          Normal IVSd (2D):      0.90  cm (0.7-1.1) LVPWd (2D):     1.26 cm (0.7-1.1) Aorta/LA:                  Normal LVIDd (2D):     2.74 cm (3.4-5.7) Left Atrium (2D): 2.50 cm (1.9-4.0) LVIDs (2D):     2.40 cm LV FS (2D):     12.4 %   (>25%) LV EF (2D):     28.0 %   (>50%)   Right Ventricle:                                   RVd (2D):        4.92 cm LV SYSTOLIC FUNCTION BY 2D PLANIMETRY (MOD): EF-A4C View: 30.6 % EF-A2C View: 35.3 % EF-Biplane: 01.0 % LV DIASTOLIC FUNCTION: MV Peak E: 1.09 m/s E/e' Ratio: 12.30                     Decel Time: 134 msec SPECTRAL DOPPLER ANALYSIS (where applicable): Mitral Valve: MV P1/2 Time: 38.86 msec MV Area, PHT: 5.66 cm?? Aortic Valve: AoV Max Vel: 1.06 m/s AoV Peak  PG: 4.5 mmHg AoV Mean PG:   2.6 mmHg LVOT Vmax: 0.70 m/s LVOT VTI: 0.118 m LVOT Diameter: 2.00 cm AoV Area, Vmax: 2.06 cm?? AoV Area, VTI: 2.20 cm?? AoV Area, Vmn: 2.56 cm?? Tricuspid Valve and PA/RV Systolic Pressure: TR Max Velocity: 2.08 m/s RA  Pressure: 5 mmHg RVSP/PASP: 22.3 mmHg  PHYSICIAN INTERPRETATION: Left Ventricle: Not well seen. The left ventricular internal cavity size  was decreased. LV posterior wall thickness was mildly increased. Left  ventricular ejection fraction, by visual estimation, is not able to  assess due to poor windows. APpears to be probably mildly decreased.%. Right Ventricle: The right ventricle was not well seen. The right  ventricular size is normal. Mitral Valve: The mitral valve is not well seen. Mild mitral valve  regurgitation is seen. Tricuspid Valve: The tricuspid valve is not well seen. The tricuspid  regurgitant velocity is 2.08 m/s, and with an assumed right atrial  pressure of 5 mmHg, the estimated right ventricular systolic pressure is  normal at 22.3 mmHg. Aortic Valve: The aortic valve was not well seen.  [place] MD Electronically signed by [id number] Bartholome Bill MD Signature Date/Time: 07/13/2013/3:43:21 PM  *** Final ***  IMPRESSION: .   Verified By: Teodoro Spray, M.D., MD  Routine Chem:  17-Jun-15 22:17   Glucose, Serum  104  BUN  29  Creatinine (comp) 0.88  Sodium, Serum  130  Potassium, Serum 3.7  Chloride, Serum  94  CO2, Serum 32  Calcium (Total), Serum  8.3  Anion Gap  4  Osmolality (calc) 267  eGFR (African American) >60  eGFR (Non-African American)  58 (eGFR values <30m/min/1.73 m2 may be an indication of chronic kidney disease (CKD). Calculated eGFR is useful in patients with stable renal function. The eGFR calculation will not be reliable in acutely ill patients when serum creatinine is changing rapidly. It is not useful in  patients on dialysis. The eGFR calculation may not be applicable to  patients at the low and high extremes of body sizes, pregnant women, and vegetarians.)  Result Comment CK - Slight hemolysis, interpret results with  - caution...tpl  Result(s) reported on 13 Jul 2013 at 01:58AM.  23-Jun-15 04:49   Glucose, Serum 91  BUN 13  Creatinine (comp)  0.55  Sodium, Serum  133  Potassium, Serum 4.1  Chloride, Serum  95  CO2, Serum 30  Calcium (Total), Serum 8.5  Anion Gap 8  Osmolality (calc) 266  eGFR (African American) >60  eGFR (Non-African American) >60 (eGFR values <49m/min/1.73 m2 may be an indication of chronic kidney disease (CKD). Calculated eGFR is useful in patients with stable renal function. The eGFR calculation will not be reliable in acutely ill patients when serum creatinine is changing rapidly. It is not useful in  patients on dialysis. The eGFR calculation may not be applicable to patients at the low and high extremes of body sizes, pregnant women, and vegetarians.)  Cardiac:  17-Jun-15 22:17   Troponin I < 0.02 (0.00-0.05 0.05 ng/mL or less: NEGATIVE  Repeat testing in 3-6 hrs  if clinically indicated. >0.05 ng/mL: POTENTIAL  MYOCARDIAL INJURY. Repeat  testing in 3-6 hrs if  clinically indicated. NOTE: An increase or decrease  of 30% or more on serial  testing suggests a  clinically important change)  CPK-MB, Serum 1.9  CK, Total 104 (26-192 NOTE: NEW REFERENCE RANGE  02/27/2013)  18-Jun-15 03:26   Troponin I  0.13 (0.00-0.05 0.05 ng/mL or less: NEGATIVE  Repeat testing in 3-6 hrs  if clinically indicated. >0.05 ng/mL: POTENTIAL  MYOCARDIAL INJURY. Repeat  testing in 3-6 hrs if  clinically indicated. NOTE: An increase or decrease  of 30% or more on serial  testing suggests a  clinically important change)    09:10   Troponin I  1.90 (0.00-0.05 0.05 ng/mL or less: NEGATIVE  Repeat testing in 3-6 hrs  if clinically indicated. >0.05 ng/mL: POTENTIAL  MYOCARDIAL INJURY. Repeat  testing in 3-6 hrs if  clinically  indicated. NOTE: An increase or decrease  of 30% or more on serial  testing suggests a  clinically important change)  19-Jun-15 04:31   Troponin I  1.50 (0.00-0.05 0.05 ng/mL or less: NEGATIVE  Repeat testing in 3-6 hrs  if clinically indicated. >0.05 ng/mL: POTENTIAL  MYOCARDIAL INJURY. Repeat  testing in 3-6 hrs if  clinically indicated. NOTE: An increase or decrease  of 30% or more on serial  testing suggests a  clinically important change)  Routine Coag:  17-Jun-15 22:17   D-Dimer, Quantitative 808 (INTERPRETATION <> Exclusion of Venous Thromboembolism (VTE) - OUTPATIENT ONLY       (Emergency Department or [place])             0-499 ng/ml (FEU)  : With a low to intermediate pretest                                  probability for VTE this test result                                  excludes the diagnosis of VTE.             > 499 ng/ml (FEU)  : VTE not excluded; additional work up                                  for VTE is required. <> Testing on Inpatients and Evaluation of Disseminated Intravascular        Coagulation (DIC)             Reference Range:  0-499 ng/ml (FEU))  Routine Hem:  17-Jun-15 22:17   WBC (CBC) 6.9  RBC (CBC)  3.33  Hemoglobin (CBC)  10.6  Hematocrit (CBC)  31.9  Platelet Count (CBC) 260 (Result(s) reported on 12 Jul 2013 at 11:08PM.)  MCV 96  MCH 31.9  MCHC 33.2  RDW  14.8  23-Jun-15 04:49   WBC (CBC) 5.9  RBC (CBC)  3.18  Hemoglobin (CBC)  9.9  Hematocrit (CBC)  30.8  Platelet Count (CBC) 188  MCV 97  MCH 31.1  MCHC 32.1  RDW  14.7  Neutrophil % 61.2  Lymphocyte % 23.0  Monocyte % 13.3  Eosinophil % 2.3  Basophil % 0.2  Neutrophil # 3.6  Lymphocyte # 1.3  Monocyte # 0.8  Eosinophil # 0.1  Basophil # 0.0 (Result(s) reported on 18 Jul 2013 at 06:25AM.)  25-Jun-15 05:16   WBC (CBC) 5.8  RBC (CBC)  3.11  Hemoglobin (CBC)  10.0  Hematocrit (CBC)  30.0  Platelet Count (CBC) 158  MCV 97  MCH 32.0  MCHC 33.2  RDW  14.6   Neutrophil % 58.0  Lymphocyte % 22.5  Monocyte % 17.2  Eosinophil % 2.0  Basophil % 0.3  Neutrophil # 3.4  Lymphocyte # 1.3  Monocyte #  1.0  Eosinophil # 0.1  Basophil # 0.0 (Result(s) reported on 20 Jul 2013 at 06:01AM.)   PERTINENT RADIOLOGY STUDIES: XRay:    17-Jun-15 23:16, Chest Portable Single View  Chest Portable Single View   REASON FOR EXAM:    shortness of breath  COMMENTS:       PROCEDURE: DXR - DXR PORTABLE CHEST SINGLE VIEW  - Jul 12 2013 11:16PM     CLINICAL DATA:  Shortness of breath.    EXAM:  PORTABLE CHEST - 1 VIEW    COMPARISON:  Chest radiograph performed 04/30/2013    FINDINGS:  The lungs are well-aerated. Mild vascular congestion is noted.  Bibasilar airspace opacities raise concern for mild pulmonary edema,  though pneumonia could have a similar appearance. No pleural  effusion or pneumothorax is seen.    The cardiomediastinal silhouette is borderline normal in size. No  acute osseous abnormalities are seen.     IMPRESSION:  Mild vascular congestion noted. Bibasilar airspace opacities raise  concern for mild pulmonary edema, though pneumonia could have a  similar appearance.      Electronically Signed    By: Garald Balding M.D.    On: 07/13/2013 00:01     Verified By: JEFFREY . CHANG, M.D.,    22-Jun-15 10:58, Chest PA and Lateral  Chest PA and Lateral   REASON FOR EXAM:    sob, cough  COMMENTS:       PROCEDURE: DXR - DXR CHEST PA (OR AP) AND LATERAL  - Jul 17 2013 10:58AM     CLINICAL DATA:  Shortness of breath and cough    EXAM:  CHEST  2 VIEW    COMPARISON:  07/12/2013, 07/13/2013    FINDINGS:  Cardiac shadow is stable. The lungs are well aerated bilaterally.  Diffuse interstitial changes are again identified without focal  confluent infiltrate. Minimal blunting of the posterior costophrenic  angle is noted. No other focal abnormality is seen.    IMPRESSION:  Chronic changes without acute  abnormality.      Electronically Signed    By: Inez Catalina M.D.    On: 07/17/2013 11:04         Verified By: Everlene Farrier, M.D.,    23-Jun-15 07:36,  Chest PA and Lateral  Chest PA and Lateral   REASON FOR EXAM:    sob  COMMENTS:       PROCEDURE: DXR - DXR CHEST PA (OR AP) AND LATERAL  - Jul 18 2013  7:36AM     CLINICAL DATA:  Shortness of breath    EXAM:  CHEST  2 VIEW    COMPARISON:  07/17/2013    FINDINGS:  The cardiac shadow is stable. The lungs are again well aerated with  diffuse interstitial/fibrotic changes. No focal confluent infiltrate  is seen. Minimal blunting of posterior costophrenic angles is again  identified. No acute bony abnormality is seen.     IMPRESSION:  No change from the previous study.      Electronically Signed    By: Inez Catalina M.D.    On: 07/18/2013 07:40         Verified By: Everlene Farrier, M.D.,  [place]:    17-Jun-15 23:16, Chest Portable Single View  PACS Image     18-Jun-15 01:45, CT [facility] Chest with for PE  PACS Image     22-Jun-15 10:58, Chest PA and Lateral  PACS Image     23-Jun-15 07:36, Chest PA and Lateral  PACS Image    Pertinent Past History:  Pertinent Past History COPD with chronic respiratory failure requiring 2 to 3 liters nasal cannula at baseline, paroxysmal atrial fibrillation, anxiety, depression, not otherwise specified, hypertension, essential tremor and disk herniation of her lower back.   Hospital Course:  Hospital Course 1. Acute Myocardial Infarction (Heart Attack) (NSTEMI) - on aspirin, inderal, imdur -  per Dr Ubaldo Glassing, suggested medical management at this point  2. Chronic respiratory failure, chronic obstructive pulmonary disease on home oxygen (2-3 liters) - continue oxygen and inhalers  3.hx afib -aspirin for anticoagulation  4. High Blood Pressure (Hypertension)   - blood pressure  labile conitnue to monitor  5. depression/anxiety; patient denies need for any meds.  6.  hyponatremia - Sodium improved  7. essential tremors: on inderal.  started skelexin prn  8. cough: cxr neg for acute patho. could be acute bronchitis  9. chronic back pain due to arthritis and herniated disc: started on MS-contin   10. Anemia of Chronic Disease: Hemoglobin dropping stedily, will recheck in am to make sure.  11. urinary retention: foley placed, voiding trial succeded. has incontinence.   Condition on Discharge Good   Code Status:  Code Status No Code/Do Not Resuscitate   DISCHARGE INSTRUCTIONS HOME MEDS:  Medication Reconciliation: Patient's Home Medications at Discharge:     Medication Instructions  amiloride-hydrochlorothiazide 5 mg-50 mg oral tablet  1 tab(s) orally once a day at 7:30am.   klor-con 10 oral tablet, extended release  1 tab(s) orally once a day at 7:30am.   premarin 0.3 mg oral tablet  1 tab(s) orally 3 times a week at 7:30am.   calcium-vitamin d 500 mg-200 intl units oral tablet  1 tab(s) orally 2 times a day (7:30am, 7:30pm).   lysine 500 mg oral tablet  1 tab(s) orally 2 times a day (7:30am, 7:30pm)   ipratropium 21 mcg/inh (0.03%) nasal spray  2 spray(s) in each nostril 1 to 2 times a day as needed.   fluocinonide 0.05% topical solution  apply to affected areas topically as needed for scalp and neck eczema.   oxygen   O2 @ 2 L via n/c at night. 3 L/m on exertion   aspirin 81 mg oral tablet  1 tab(s) orally  2 times a day   albuterol-ipratropium 2.5 mg-0.5 mg/3 ml inhalation solution  3 milliliter(s) inhaled 4 times a day Nebulizer   omeprazole 20 mg oral delayed release tablet  1 tab(s) orally 2 times a day   hydrocort cream 0.5% topical cream  Apply topically to affected area 4 times a day   naftin 1% topical gel  Apply topically to affected area once a day   dulera 5 mcg-100 mcg/inh inhalation aerosol  2 puff(s) inhaled 2 times a day   furosemide 20 mg oral tablet  1 tab(s) orally once a day   ultram 100 mg/24 hours oral tablet, extended  release  1 tab(s) orally once a day   celebrex 200 mg oral capsule  1 cap(s) orally 2 times a day   losartan 50 mg oral tablet  1 tab(s) orally once a day   antivert 25 mg tablet  1 tab(s) orally 3 times a day - as needed   chlorpheniramine extended release 12 mg oral tablet, extended release  1 tab(s) orally 3 times a day as needed   non-formulary medication  2 puff(s) inhaled 2 times a day    PRESCRIPTIONS: ELECTRONICALLY SUBMITTED  STOP TAKING THE FOLLOWING MEDICATION(S):    trimethoprim 100 mg oral tablet: 1 tab(s) orally once a day nystatin 100000 units/ml suspension: 5 mL orally 4 times a day x 7 days  vibramycin hyclate 100 mg oral capsule: 1 cap(s) orally 2 times a day  Physician's Instructions:  Diet Low Sodium  Low Fat, Low Cholesterol   Diet Consistency Regular Consistency   Activity Limitations As tolerated   Return to Work Not Applicable   Time frame for Follow Up Appointment 1-2 days  physician at facility   Other Comments palliative care to follow while at facility and if clinical condition worsens - consider hospice     [place], Jim(Family Physician): [facility], [address], [place], [place] [address], [place] [phone]  Electronic Signatures: Vaughan Basta (MD)  (Signed 25-Jun-15 09:59)  Authored: ADMISSION DATE AND DIAGNOSIS, CHIEF COMPLAINT/HPI, Allergies, PERTINENT LABS, PERTINENT RADIOLOGY STUDIES, PERTINENT PAST HISTORY, HOSPITAL COURSE, [place], PATIENT INSTRUCTIONS, Follow Up Physician   Last Updated: 25-Jun-15 09:59 by Vaughan Basta (MD)

## 2014-05-19 NOTE — Consult Note (Signed)
Chief Complaint:  Subjective/Chief Complaint The patient states that improve shortness of breath denies any chest pain feels much better.   VITAL SIGNS/ANCILLARY NOTES: **Vital Signs.:   22-Jul-15 09:21  Vital Signs Type Routine  Temperature Temperature (F) 97.5  Celsius 36.3  Pulse Pulse 89  Respirations Respirations 20  Systolic BP Systolic BP 332  Diastolic BP (mmHg) Diastolic BP (mmHg) 71  Mean BP 100  Pulse Ox % Pulse Ox % 98  Pulse Ox Activity Level  At rest  Oxygen Delivery 3L  *Intake and Output.:   22-Jul-15 08:54  Grand Totals Intake:   Output:  200    Net:  -200 27 Hr.:  -110  Urine ml     Out:  200  Urinary Method  Void; [facility]  Rehab Summary:   22-Jul-15 12:05  Rehab Progress Summary Rehab Progress Summary Physical Therapy   Pt. presents with gross weakness and fatigue secondary to myocardial infarction. Would benefit from skilled PT to address above deficits and promote optimal return to PLOF. Recommend STR at discharge.  Anticipated Discharge Disposition  SNF/STR   Brief Assessment:  GEN well developed, well nourished, no acute distress   Cardiac Regular   Respiratory normal resp effort  clear BS   Gastrointestinal Normal   Gastrointestinal details normal Soft  Nontender  Nondistended   EXTR negative cyanosis/clubbing, negative edema   Lab Results: Routine Chem:  21-Jul-15 02:54   Glucose, Serum  110  BUN  24  Creatinine (comp) 1.05  Sodium, Serum  134  Potassium, Serum  3.4  Chloride, Serum  94  CO2, Serum  33  Calcium (Total), Serum 8.7  Anion Gap 7  Osmolality (calc) 273  eGFR (African American)  54  eGFR (Non-African American)  47 (eGFR values <16m/min/1.73 m2 may be an indication of chronic kidney disease (CKD). Calculated eGFR is useful in patients with stable renal function. The eGFR calculation will not be reliable in acutely ill patients when serum creatinine is changing rapidly. It is not useful in  patients on dialysis. The  eGFR calculation may not be applicable to patients at the low and high extremes of body sizes, pregnant women, and vegetarians.)    23:54   Result Comment TROPONIN - RESULTS VERIFIED BY REPEAT TESTING.  - PREV. C/ 08-14-13 @1830  BY JEM..Marland KitchenJO  Result(s) reported on 15 Aug 2013 at 12:45AM.  Cardiac:  21-Jul-15 02:54   CPK-MB, Serum 2.6 (Result(s) reported on 15 Aug 2013 at 03:28AM.)    23:54   Troponin I  0.71 (0.00-0.05 0.05 ng/mL or less: NEGATIVE  Repeat testing in 3-6 hrs  if clinically indicated. >0.05 ng/mL: POTENTIAL  MYOCARDIAL INJURY. Repeat  testing in 3-6 hrs if  clinically indicated. NOTE: An increase or decrease  of 30% or more on serial  testing suggests a  clinically important change)  CPK-MB, Serum 3.2 (Result(s) reported on 15 Aug 2013 at 12:37AM.)  Routine UA:  21-Jul-15 09:25   Color (UA) Yellow  Clarity (UA) Clear  Glucose (UA) Negative  Bilirubin (UA) Negative  Ketones (UA) Negative  Specific Gravity (UA) 1.008  Blood (UA) Negative  pH (UA) 8.0  Protein (UA) Negative  Nitrite (UA) Positive  Leukocyte Esterase (UA) Negative (Result(s) reported on 15 Aug 2013 at 09:40AM.)  RBC (UA) 1 /HPF  WBC (UA) 1 /HPF  Bacteria (UA) 2+  Epithelial Cells (UA) 1 /HPF (Result(s) reported on 15 Aug 2013 at 09:40AM.)  Routine Hem:  21-Jul-15 02:54   WBC (CBC) 5.2  RBC (  CBC)  2.94  Hemoglobin (CBC)  9.0  Hematocrit (CBC)  28.0  Platelet Count (CBC) 211  MCV 95  MCH 30.7  MCHC 32.2  RDW 14.4  Neutrophil % 50.5  Lymphocyte % 27.2  Monocyte % 17.9  Eosinophil % 4.0  Basophil % 0.4  Neutrophil # 2.6  Lymphocyte # 1.4  Monocyte # 0.9  Eosinophil # 0.2  Basophil # 0.0 (Result(s) reported on 15 Aug 2013 at 03:28AM.)  22-Jul-15 04:00   WBC (CBC) 4.9  RBC (CBC)  3.07  Hemoglobin (CBC)  9.7  Hematocrit (CBC)  29.0  Platelet Count (CBC) 216  MCV 95  MCH 31.5  MCHC 33.3  RDW  14.6  Neutrophil % 45.0  Lymphocyte % 33.3  Monocyte % 17.1  Eosinophil % 4.3   Basophil % 0.3  Neutrophil # 2.2  Lymphocyte # 1.6  Monocyte # 0.8  Eosinophil # 0.2  Basophil # 0.0 (Result(s) reported on 16 Aug 2013 at 05:14AM.)   Radiology Results: XRay:    20-Jul-15 19:15, Chest Portable Single View  Chest Portable Single View   REASON FOR EXAM:    chest pain  COMMENTS:       PROCEDURE: DXR - DXR PORTABLE CHEST SINGLE VIEW  - Aug 14 2013  7:15PM     CLINICAL DATA:  Chest pain.    EXAM:  PORTABLE CHEST - 1 VIEW    COMPARISON:  07/24/2013    FINDINGS:  There is hyperinflation of the lungs compatible with COPD. Heart is  normal size. Predominately linear density at the right lung base,  likely atelectasis. No focal opacity on the left. No effusions. No  acute bony abnormality.     IMPRESSION:  COPD.  Right base atelectasis.      Electronically Signed    By: Rolm Baptise M.D.    On: 08/14/2013 19:23         Verified By: Raelyn Number, M.D.,  Cardiology:    20-Jul-15 17:10, ED ECG  Ventricular Rate 100  Atrial Rate 100  P-R Interval 164  QRS Duration 80  QT 356  QTc 459  P Axis 84  R Axis 79  T Axis 93  ECG interpretation   Normal sinus rhythm  Possible Left atrial enlargement  Left ventricular hypertrophy  ST & T wave abnormality, consider anterior ischemia  Abnormal ECG  When compared with ECG of 24-Jul-2013 18:22,  No significant change was found  ----------unconfirmed----------  Confirmed by OVERREAD, NOT ([id number]), editor PEARSON, BARBARA ([id number]) on 08/15/2013 9:18:58 AM  ED ECG    Assessment/Plan:  Assessment/Plan:  Assessment IMP 1 demand ischemia elevated troponin 2 angina 3 atrial fibrillation 4 COPD 5 hypertension 6 GERD 7 CHF .   Plan PLAN 1 troponins have been relatively flat this represents demand ischemia do not recommend further cardiac studies at this point 2 congestive heart failure continue Lasix therapy 3 GERD continue omeprazole therapy 4 COPD agree with inhalers oxygen p.r.n. 5 atrial fibrillation  continue rate control and anticoagulation 6 for hypertension continue current therapy with reasonable blood pressure control 7 chest pain now appears to be noncardiac do not recommend further cardiac studies 8 agree with discharge today 9 outpatient follow-up with Cardiology once 2 weeks   Electronic Signatures: Lujean Amel D (MD)  (Signed 29-Jul-15 14:26)  Authored: Chief Complaint, VITAL SIGNS/ANCILLARY NOTES, Brief Assessment, Lab Results, Radiology Results, Assessment/Plan   Last Updated: 29-Jul-15 14:26 by Lujean Amel D (MD)

## 2014-05-19 NOTE — H&P (Signed)
PATIENT NAME:  [name], [name] MR#:  [id number] DATE OF BIRTH:  03/05/1921  DATE OF ADMISSION:  08/14/2013  ADMITTING PHYSICIAN: Enid Baasadhika Dwan Fennel, MD  PRIMARY CARE PHYSICIAN: Rhona LeavensJames F. Burnett ShengHedrick, MD   PRIMARY CARDIOLOGIST: Lamar BlinksBruce J. Kowalski, MD  CHIEF COMPLAINT: Chest pain and heartburn.   HISTORY OF PRESENT ILLNESS: Ms. [name] is a 81 year old elderly Caucasian female with past medical history significant for hypertension, chronic respiratory failure secondary to chronic obstructive pulmonary disease on 3 liters of home oxygen, history of atrial fibrillation on an aspirin for anticoagulation, congestive heart failure and recent admission for non-ST-elevation myocardial infarction in June 2015 with management so basically discharged to rehab about 4 weeks ago, presents back from home now secondary to chest pain again. The patient states even at the rehab she started to have what she describes as heartburn, pain in the epigastric region  that goes up her chest and also radiates to her back a few times. She got discharged from the rehab and since then she has had 3 episodes.  With 2 of them she called EMS. They came, checked her symptoms, EKG was fine so they said it was not cardiac but this time the pain was intense and she came to the hospital. Her pain usually lasts about 30 to 45 minutes, starts in the epigastric region, heartburn-like squeezing chest pain. Denies any nausea associated with that. No diaphoresis. However, her troponin is seen to be mildly elevated at 0.5 which is elevated from 0.2, her discharge troponin about 4 weeks ago, so she is being admitted for non-ST-elevation myocardial infarction.  The patient is currently chest pain-free.   PAST MEDICAL HISTORY: 1.  Coronary artery disease. No prior cardiac catheterization.  2.  Recent admission for non-ST-elevation myocardial infarction.   3.  Gastroesophageal reflux disease.  4.  Hypertension.  5.  Essential tremors.  6.   Osteoarthritis.  7.  History of transient ischemic attacks.  8.  Chronic respiratory failure secondary to chronic obstructive pulmonary disease, on 3 liters home oxygen.  9.  Paroxysmal atrial fibrillation.  10.  Anxiety and depression.   PAST SURGICAL HISTORY: 1.  Appendectomy.  2.  Bladder surgery.  3.  Total hysterectomy.  4.  Benign cyst removed from breast.  5.  Right parotid tumor removal.  6.  Renal artery stenosis, status post graft surgery.   ALLERGIES: BACTRIM, PENICILLIN, SULFA, LACTOSE AND TAPE.   CURRENT HOME MEDICATIONS:  1.  Aspirin 81 mg p.o. b.i.d.  2.  Antivert 25 mg p.o. t.i.d. p.r.n.  3.  Amiloride/HCTZ 5/50 mg 1 tablet p.o. daily.  4.  Calcium/vitamin D 500 mg/200 international units 1 tablet p.o. b.i.d.  5.  Chlorpheniramine extended-release 12 mg oral tablet 3 times a day as needed.  6.  Dulera 5/100 mcg 2 puffs b.i.d.  7.  Lasix 20 mg p.o. daily.  8.  Ipratropium nasal spray 2 sprays each nostril 1 to 2 times a day as needed.  9.  Imdur 30 mg p.o. daily.  10.  Klor-Con 10 mEq p.o. daily.  11.  Skelaxin 800 mg 3 times a day p.r.n.  12.  Morphine sulfate extended-release 15 mg p.o. twice a day.  13.  Propranolol 20 mg p.o. b.i.d.   SOCIAL HISTORY: Lives at home by herself but since discharge from rehab home health has been in place. She using a rollator for ambulation and also has a cane at home. Quit smoking more than 3 years ago. No alcohol abuse.   FAMILY  HISTORY: Significant for breast cancer, lung cancer, coronary artery disease in the family.   REVIEW OF SYSTEMS:   CONSTITUTIONAL: No fever, fatigue or weakness.  EYES: No blurred vision, double vision, inflammation or glaucoma.  ENT: No tinnitus, ear pain. Positive for hearing loss.  No epistaxis or discharge.  RESPIRATORY: Positive for cough, chronic obstructive pulmonary disease. No hemoptysis or dyspnea on exertion.  CARDIOVASCULAR: Positive for chest pain. No orthopnea, edema, arrhythmia,  palpitations or syncope.  GASTROINTESTINAL: Positive for reflux disease. No nausea, vomiting, diarrhea, abdominal pain, hematemesis or melena.  GENITOURINARY: No dysuria, hematuria, renal calculus, frequency or incontinence.  ENDOCRINE: No polyuria, nocturia, thyroid problems, heat or cold intolerance.  HEMATOLOGY: No anemia, easy bruising or bleeding.  SKIN: No acne, rash or lesions.  MUSCULOSKELETAL: Positive for arthritis and back pain and leg pain.  NEUROLOGIC: No numbness, weakness, cerebrovascular accident, transient ischemic attack or seizures.  PSYCHOLOGICAL: No anxiety, insomnia, depression.   PHYSICAL EXAMINATION: VITAL SIGNS: Temperature 98.1 degrees Fahrenheit, pulse 97, respirations 22, blood pressure 124/53, pulse oximetry 89% to 90% on 3 liters oxygen.  GENERAL: :Well-nourished, well-built female sitting in bed, not in any acute distress.  HEENT: Normocephalic, atraumatic. Pupils equal, round, reacting to light. Anicteric sclerae. Extraocular movements intact. Oropharynx clear without erythema, mass or exudates.  NECK: Supple. No thyromegaly, JVD or carotid bruits. No lymphadenopathy.  LUNGS: Moving air bilaterally. No wheeze or crackles. No use of accessory muscles for breathing.  CARDIOVASCULAR: S1, S2, regular rate and rhythm. A 3/6 systolic murmur present. No rubs or gallops.  ABDOMEN: Soft, nontender, nondistended. No hepatosplenomegaly. Normal bowel sounds.  EXTREMITIES: Positive for minimal pedal edema. No clubbing or cyanosis, 2+ dorsalis pedis pulses palpable bilaterally.  SKIN: No acne, rash or lesions.  LYMPHATICS: No cervical lymphadenopathy.  NEUROLOGIC: Cranial nerves intact. No focal motor or sensory deficits.  PSYCHOLOGICAL: The patient is awake, alert, oriented x 3.   LABORATORY, DIAGNOSTIC AND RADIOLOGICAL DATA:   1.  WBC 7.1, hemoglobin 9.9, hematocrit 30.8, platelet count 249.  2.  Sodium 132, potassium 3.9, chloride 92, bicarbonate 33, BUN 27, creatinine  1.07, glucose 122 and calcium of 8.9.  3.  BNP is 1712, CK-MB is elevated at 3.7. PTT is 34.4. INR 0.9. Troponin of 0.85.  4.  Chest x-ray revealing no pulmonary congestion. Chronic obstructive pulmonary disease and right base atelectasis noted.  5.  EKG showing normal sinus rhythm, left ventricular hypertrophy pattern. No acute ST-T wave abnormalities.   ASSESSMENT AND PLAN: A 81 year old female with known history of chronic obstructive pulmonary disease, coronary artery disease and congestive heart failure. Recent discharge from the hospital for non-ST-elevation myocardial infarction, comes back again with chest pain and elevated troponin.  1.  Non-ST-elevation myocardial infarction.  We will admit to telemetry, start heparin drip, recycle troponins x 3. Troponin, this first one is elevated and her last discharge troponin.  Currently chest pain-free. Likely medical management recommended. Cardiology consult by Dr. Lady Gary. Continue cardiac medications. She is already on Imdur, not sure if she needs something for chronic angina.  2.  Congestive heart failure, elevated BNP. Chest x-ray clear. Continue home dose of Lasix, appears well compensated.  3.  Chronic obstructive pulmonary disease. Continue her inhalers. She is on home oxygen, appears stable.  4.  Hypertension. Continue home medications.  5.  Deep vein thrombosis prophylaxis, on heparin.   TIME SPENT ON ADMISSION: 50 minutes.   ____________________________ Enid Baas, MD rk:cs D: 08/14/2013 20:01:24 ET T: 08/14/2013 20:14:36 ET JOB#: [id number]  cc: Enid Baas, MD, <Dictator> Rhona Leavens. Burnett Sheng, MD Lamar Blinks, MD Enid Baas MD ELECTRONICALLY SIGNED 08/16/2013 13:25

## 2014-05-19 NOTE — Discharge Summary (Signed)
PATIENT NAME:  [name], [name] MR#:  [id number] DATE OF BIRTH:  1921/03/19  DATE OF ADMISSION:  08/14/2013 DATE OF DISCHARGE:  08/16/2013  DISCHARGE DIAGNOSES:  1. Borderline elevated troponin is likely due to supply/demand ischemia. A myocardial infarction has been ruled out.  Medical management recommended per cardiology.  2. Chronic diastolic heart failure, well compensated at this time.   SECONDARY DIAGNOSES:  1.  Coronary artery disease.  2.  History of recent non-ST elevation myocardial infarction.  3.  Gastroesophageal reflux disease.  4.  Hypertension.  5.  Essential tremor. 6.  Osteoarthritis.  7.  History of transient ischemic attack.  8.  Chronic respiratory failure on 3 liters of oxygen.  9.  Paroxysmal atrial fibrillation.  10.  Anxiety and depression.   CONSULTATION: Cardiology, Dr. Dorothyann Pengwayne Callwood.   PROCEDURES AND RADIOLOGY: Chest x-ray on July 20 showed COPD. Right base atelectasis.   MAJOR LABORATORY PANEL: Urinalysis on admission was negative.   HISTORY AND SHORT HOSPITAL COURSE: The patient is a 81 year old female with above-mentioned medical problems who was admitted for chest pain and heartburn with borderline troponins.  I was concern for possible non-ST-elevation myocardial infarction. Please see Dr. Prudencio PairKalisetti's dictated history and physical for further details. The patient's serial troponins started trending down. Cardiology consultation was obtained with Dr. Dorothyann Pengwayne Callwood as patient was concerned about possible heart condition. This was ruled out. Her chest pain was much better, but considering her weakness, she is being transferred to skilled nursing [facility] as per physical therapy recommendation. On the date of discharge, her vital signs are as follows: Temperature 98.1, heart rate 88 per minute, respirations 18 per minute, blood pressure 139/69 mmHg.  She is saturating 96% on 3 liters oxygen via nasal cannula.   PHYSICAL EXAMINATION ON  DATE OF DISCHARGE: CARDIOVASCULAR: S1, S2 normal. No murmurs, rubs, or gallops.  LUNGS: Clear to auscultation bilaterally. No wheezing, rales, rhonchi, or crepitation.  ABDOMEN: Soft, benign.  NEUROLOGIC: Nonfocal examination.   All other physical examination remained at baseline.   DISCHARGE MEDICATIONS:  1.  Pamelor/hydrochlorothiazide 5/50 mg 1 tablet p.o. daily.  2.  Klor-Con 10 mEq p.o. daily.  3.  Calcium with vitamin D 1 tablet p.o. b.i.d.  4.  Ipratropium 2 sprays in each nostril 1-2 times a day as needed.  5.  Aspirin 81 mg p.o. b.i.d.  6.  Dulera 2 puffs inhaled twice a day.  7.  Lasix 20 mg p.o. daily.  8.  Antivert 25 mg p.o. 3 times a day as needed.  9.  Chlorpheniramine 12 mg tablet 3 times a day as needed.  10.  Morphine 15 mg/12 hour oral tablet 1 tablet twice a day.  11.  Propranolol 20 mg p.o. b.i.d.  12.  Imdur 30 mg p.o. daily. 13.  Metaxalone 800 mg p.o. 3 times a day as needed.  14.  Losartan 50 mg p.o. daily.  15.  Protonix 40 mg p.o. b.i.d. 16.  Fluocinonide 0.05% topical solution to affected area twice a day as needed.   DISCHARGE DIET: Low sodium, low fat, low cholesterol.   DISCHARGE ACTIVITY: As tolerated.   DISCHARGE INSTRUCTIONS AND FOLLOW-UP: The patient was instructed to follow up with her primary care physician, Dr. Tera MaterJim Hedrick in 2-4 weeks. She will need follow-up with Dr. Arnoldo HookerBruce Kowalski in 1-2 weeks from cardiology. She will get physical therapy evaluation and management while at the facility.   TOTAL TIME DISCHARGING THIS PATIENT:  55 minutes.  ____________________________ Ellamae Sia. Sherryll Burger, MD vss:ts D: 08/16/2013 12:46:58 ET T: 08/16/2013 13:29:07 ET JOB#: [id number]  cc: Latima Hamza S. Sherryll Burger, MD, <Dictator> Rhona Leavens. Burnett Sheng, MD Lamar Blinks, MD   Ellamae Sia [facility] MD ELECTRONICALLY SIGNED 08/17/2013 20:47

## 2014-05-19 NOTE — Consult Note (Signed)
PATIENT NAME:  [name], [name] MR#:  [id number] DATE OF BIRTH:  Jun 20, 1921  DATE OF CONSULTATION:  08/15/2013  PRIMARY PHYSICIAN:  Dr. Burnett ShengHedrick.  CARDIOLOGIST:  Dr. Gwen PoundsKowalski.  CONSULTING PHYSICIAN:  Janisse Ghan D. Claira Jeter, MD.  INDICATION FOR CONSULT:  Chest pain versus heartburn.  HISTORY OF PRESENT ILLNESS: Ms. [name] is a 81 year old white female with past history of hypertension and respiratory failure, COPD, on 2 liters oxygen at home, history of atrial fibrillation on aspirin anticoagulation. She has had congestive heart failure, recent admission for non-ST segment elevation myocardial infarction in June, treated medically.  She was seen by Dr. Lady GaryFath at the time. She presents this time because she has had 3 or 4 episodes while at rehabilitation of chest pain where rescue was called and it was not clear whether it was indigestion or angina. She finally came to the Emergency Room and was admitted for further evaluation and management.  EKG was nondiagnostic. She complains of epigastric chest pains, squeezing, some radiating into her throat. She denies any nausea. No diaphoresis. Initial troponins were 0.5, which is slightly up from before, 0.2 on discharge. So she was admitted for further evaluation and management. She remains relatively pain free afterward now.   PAST MEDICAL HISTORY: Coronary disease, non-ST segment elevation myocardial infarction, reflux, hypertension, essential tremors, osteoarthritis, TIAs, respiratory failure, COPD, hyperlipidemia, paroxysmal atrial fibrillation, anxiety, depression.   PAST SURGICAL HISTORY: Appendectomy, bladder surgery, hysterectomy, benign cysts in the breasts, parathyroid tumor removal, renal artery stenosis, status post graft.  ALLERGIES: BACTRIM, PENICILLIN, SULFA, LACTOSE AND TAPE.   MEDICATIONS:  Aspirin 81 mg daily, Antivert 25 mg p.r.n., amiloride/HCTZ 5/50 once a day, calcium,  chlorpheniramine extended release 12 mg 3 tablets a day as  needed, Dulera 5/100 2 puffs twice a day, Lasix 20 mg a day, Imdur 30 mg a day, Klor-Con 10 mEq a day, Skelaxin 800 mg 2 times a day, morphine sulfate extended release 15 mg twice a day, propranolol 20 mg twice a day.   SOCIAL HISTORY:  Lives at home by herself but since discharge is now in rehab.  Uses a walker, quit smoking 3 years ago.   FAMILY HISTORY:  Breast cancer. Coronary artery disease.   REVIEW OF SYSTEMS:  No blackout spells, syncope. Denies nausea or vomiting. No fever, no chills. No sweats. No weight loss, no weight gain. Complains of indigestion, chest pain, weakness and fatigue at times.   PHYSICAL EXAMINATION: VITAL SIGNS:  Blood pressure 120/50, pulse 90, respiratory rate 16, afebrile.  HEENT: Normocephalic, atraumatic. Pupils equal and reactive to light.  NECK: Supple. No significant JVD, bruits or adenopathy.  LUNGS: And percussion. No wheeze, rhonchi, or rale. HEART: Regular rate and rhythm, slightly irregular at times. Systolic ejection murmur left sternal border.   ABDOMEN:  Benign.  EXTREMITY: Within normal limits.  NEUROLOGIC: Intact.  SKIN: Normal.   LABORATORY DATA:  White count 7.1, hemoglobin 9.9, hematocrit 30.8, platelet count 249, sodium 132, potassium 3.9, chloride 19, bicarbonate 33, BUN 27, creatinine 1.07, glucose 122, calcium 8.9, BNP 1712, PTT 34, INR 0.9, troponin 0.85.   Chest x-ray revealed mild pulmonary congestion, COPD, possible atelectasis in the bases.  EKG: Sinus rhythm, nonspecific.   ASSESSMENT:   1.  Non-ST segment elevation myocardial infarction with elevated troponins, chest pain symptoms, not clear whether it is cardiac or gastrointestinal. We will follow up troponins. Follow up EKG, place on telemetry, adjust medications.   2.  Congestive heart failure with congestion on chest x-ray. Will continue  to follow up chest x-ray, Lasix as necessary.  Continue Imdur.  See if we can diurese her.  3.  Chronic obstructive pulmonary disease.  Continue inhalers, home oxygen. Consider whether steroid therapy will be necessary.  4.  Hypertension, continue current medications, reasonable blood pressure control at this point.  5.  Continue DVT prophylaxis, also will consider whether treatment of gastrointestinal  symptoms would be helpful. Consider adding larger doses of either Protonix or Nexium to see if that helps any of her symptoms. Consider whether GI consult would be helpful. We will recommend a conservative cardiology approach and treat the patient medically for now.    ____________________________ Cyndy Braver D. Juliann Pares, MD ddc:lt D: 08/16/2013 07:31:00 ET T: 08/16/2013 08:11:37 ET JOB#: [id number]  cc: Lauriana Denes D. Juliann Pares, MD, <Dictator> Alwyn Pea MD ELECTRONICALLY SIGNED 09/11/2013 10:22

## 2014-05-20 NOTE — H&P (Signed)
PATIENT NAME:  [name], [name] MR#:  [id number] DATE OF BIRTH:  07/22/1921  DATE OF ADMISSION:  06/17/2011  PRIMARY CARE PHYSICIAN: Jerl Mina, MD  EMERGENCY ROOM PHYSICIAN: Dr. Manson Passey  CHIEF COMPLAINT: Chest tightness.   HISTORY OF PRESENT ILLNESS: This is an 81 year old female with history of hypertension, chronic obstructive pulmonary disease, history of essential tremors, depression, and arthritis who came in because of chest pain. The patient says that she feels chest pressure, mainly started around last night and her breathing also was worse. She had to use a nebulizer and because it did not get better she came in here. Mainly she complains of chest tightness, not radiating to the left arm of the neck. No nausea, no vomiting, and no dizziness. She has no cough, no fever. The patient has exertional dyspnea, on 2 liters of oxygen all the time. No pedal edema. She thinks her lungs are having COPD flare and asking for DuoNebs.   PAST MEDICAL HISTORY: 1. History of transient ischemic attack.  2. Hypothyroidism, status post radioactive iodine and resection. 3. History of chronic obstructive pulmonary disease, on oxygen 2 liters at rest and 3 liters on exertion. 4. Depression. 5. Essential tremors.  DRUG ALLERGIES: Penicillin - gives rash. Sulfa - gives some hallucinations. Lactose and tape.  SOCIAL HISTORY: Previous smoker, now quit. No alcohol. No drugs.  PAST SURGICAL HISTORY:  1. Cataract surgery.  2. Total abdominal hysterectomy with bilateral salpingo-oophorectomy. 3. Surgery for congenital renal artery stenosis. 4. Appendectomy. 5. Breast cyst aspiration. 6. Bladder surgery.  7. Benign endocrine tumor resection.   8. Bladder tack.  MEDICATIONS:  1. Omeprazole 20 mg daily  2. Diovan 160 mg p.o. twice a day. 3. Amiloride/HCTZ 5/50 mg daily.  4. KCl 10 milliequivalents p.o. daily.  5. Celebrex 200 mg p.o. daily.  6. Premarin 0.3 mg three times a week. 7. Lysine 500 mg  p.o. twice a day. 8. Spiriva 1 capsule inhalation daily.  9. Fluocinonide as needed for scalp and eczema. 10. Combivent two times per day as needed.  11. Oxygen 2 liters via nasal cannula.  12. Recently started on Bystolic 10 mg daily, three to four weeks ago.  13. She is not taking Toprol anymore.   SOCIAL HISTORY: As mentioned. Also she lives at [facility].   FAMILY HISTORY: Significant for hypertension and also stroke on the father's side.   REVIEW OF SYSTEMS: CONSTITUTIONAL: No fever. No chills. No nausea. No vomiting. No glaucoma. ENT: No ear pain. The patient is hard of hearing. No difficulty swallowing. RESPIRATORY: The patient denies any cough, but does have trouble breathing. CARDIOVASCULAR: Mainly chest tightness. No orthopnea. The patient has no pedal edema. GI: No nausea. No vomiting. No abdominal pain. GENITOURINARY: No dysuria. ENDOCRINE: The patient has no heat or cold intolerance. HEMATOLOGIC: No easy bruising. SKIN: History of eczema. MUSCULOSKELETAL: Has history of arthritis. NEUROLOGIC: The patient denies any visual disturbance at this time. No weaknesses.   PHYSICAL EXAMINATION:   VITAL SIGNS: Temperature 97.8, pulse 94, respirations 20, blood pressure 174/87, and saturation 98% on room air.   GENERAL: She is alert, awake, and oriented, slightly anxious female, not in distress, answering questions appropriately.   HEAD/EYES: Head atraumatic, normocephalic. Pupils equal and reacting to light. Extraocular movements are intact.   ENT: The patient has no tympanic membrane congestion. Hard of hearing. Mild turbinate hypertrophy. Throat has no oropharyngeal erythema.   NECK: Normal range of motion. No JVD. No carotid bruit.   CARDIOVASCULAR: S1 and  S2 regular. No murmurs.   LUNGS: Faint expiratory wheeze present in left middle lobe. No rales. Clear to auscultation bilaterally.   ABDOMEN: Soft, nontender, and nondistended. Bowel sounds present.   EXTREMITIES: No extremity  edema. No cyanosis. No clubbing.   NEUROLOGICAL: Cranial nerves II through XII are intact. Power 5/5 in upper and lower extremities. Sensations are intact. Deep tendon reflexes 2+ bilaterally.   PSYCH: Mood and affect are within normal limits, slightly anxious.   LABS/STUDIES: Chest x-ray showed hyperinflation and chronic obstructive pulmonary disease. No acute cardiopulmonary event.   WBC 7.5, hemoglobin 12.3, hematocrit 36.7, and platelets 163.   Electrolytes: Sodium 136, potassium 3.5, chloride 97, bicarbonate 32, BUN 23, creatinine 0.5, and glucose 99. Troponin less than 0.02. CPK-MB 2.6. CK total 56.   EKG: Normal sinus rhythm, no ST-T changes, 94 beats per minute.   ASSESSMENT AND PLAN:  1. An 81 year old female with chest pressure mainly likely secondary to chronic obstructive pulmonary disease flare, but rule out myocardial infarction. The patient's troponins have been negative. Continue to monitor two more sets and monitor on telemetry. Continue her on aspirin, sublingual nitroglycerin as needed, and also beta blockers along with and echocardiogram. If the patient's troponin goes up,  we will request cardiology consultation. At this time, I think it is probably coming from her lungs.  2. Acute on chronic respiratory failure due to COPD flare. The patient is already on DuoNebs. Continue that and empiric antibiotics with Z-Pak. No indication for steroids at this time as she has minimal wheezing. Continue Spiriva as well.  3. Hypertension. She is on amiloride with HCTZ. Continue that. Also Bystolic to be continued.  4. Gastroesophageal reflux disease. Continue Prilosec.   I spoke with the patient and the patient's daughter at bedside.   TIME SPENT: Approximately 60 minutes.   ____________________________ Katha HammingSnehalatha Tyeasha Ebbs, MD sk:slb D: 06/17/2011 11:13:11 ET T: 06/17/2011 12:16:32 ET JOB#: [id number]  cc: Katha HammingSnehalatha Alisyn Lequire, MD, <Dictator> Katha HammingSNEHALATHA Kayona Foor MD ELECTRONICALLY  SIGNED 06/25/2011 11:11

## 2014-05-20 NOTE — Discharge Summary (Signed)
PATIENT NAME:  [name], [name] MR#:  [id number] DATE OF BIRTH:  April 02, 1921  DATE OF ADMISSION:  06/17/2011 DATE OF DISCHARGE:  06/18/2011  PRIMARY CARE PHYSICIAN:  Dr. Jerl MinaJames Wilkerson.   CONSULTATIONS: None.   DISCHARGE DIAGNOSES: 1. Acute on chronic respiratory failure, chronic obstructive pulmonary disease exacerbation. 2. Hypertension.  3. Senile tremors. 4. Chronic obstructive pulmonary disease.  DISCHARGE MEDICATIONS:  1. Aspirin 81 mg p.o. 2 times daily. 2. Albuterol with ipratropium nebulizer every 4 times a day. 3. Bystolic 10 mg daily. 4. Omeprazole 20 mg once a day. 5. Diovan 160 mg p.o. b.i.d.  6. Amiloride/HCTZ 5/50 mg once a day.  7. KCl 10 milliequivalents p.o. daily.  8. Celebrex 200 mg p.o. b.i.d.  9. Premarin 0.3 mg 3 times a week.  10. Calcium with vitamin D 500/200, one tablet p.o. b.i.d.  11. Lysine 500 mg 1 tablet p.o. b.i.d.  12. Ipratropium nasal spray two sprays each nostril 2 times a day as needed.  NEW MEDICINE:  1. Prednisone 40 mg daily for two days, 30 mg daily for two days, 20 mg daily for two days, 10 mg daily for two days, then stop.  2. Z-Pak five day course.   DIET: Low sodium diet.   OXYGEN: 2 liters via nasal cannula at rest and three liters on exertion.   DISCHARGE VITALS: Temperature 97.6, pulse 90, respirations 20, blood pressure 180/72, sats 97% on 2 liters.   HISTORY: This is an 81 year old female was admitted yesterday for chest pressure and shortness of breath. The patient is an 81 year old female with chronic obstructive pulmonary disease on oxygen 2 liters, [facility], came in with chest pressure and shortness of breath. Please see the history and physical for full details. She was admitted for chest pain and also chronic obstructive pulmonary disease flare. The patient has some mild wheezing on admission.   LABORATORY, RADIOLOGICAL AND DIAGNOSTIC DATA: Lab data within normal limits. Chest x-ray shows  chronic obstructive pulmonary disease. No acute disease. EKG has normal sinus with no ST-T changes. The patient was placed on telemetry, started on aspirin, sublingual nitroglycerin and beta blockers. The patient's troponins have been negative x3, first one at 0.02, second one 0.07, third one 0.05 with normal CK and CPK-MB.   HOSPITAL COURSE: The patient was started on nebulizers, steroids and azithromycin. She is allergic to penicillin, so we just started her on Solu-Medrol and azithromycin. She was seen this morning, says she is much better, no shortness of breath, ate her breakfast and is eager to go home and we discharged her with prednisone and Z-Pak. Continue her nebulizer. For her heart and blood pressure, she is on amiloride with HCTZ, Bystolic, Diovan and potassium. She can continue that. Blood pressure has been in control. The patient also has a history of chronic obstructive pulmonary disease and history of smoking before, on 2 liters of oxygen, continue that. The patient is requesting some help to help her with shower and daily activities at [facility], so I told the case manager and they are working on it. The patient has already been seen by physical therapy and she is able to walk with a walker. Physical therapy recommended home with home health. Condition is stable.   TIME SPENT ON DISCHARGE PREPARATION: More than 30 minutes.  ____________________________ Tammy HammingSnehalatha Luticia Tadros, MD sk:ap D: 06/18/2011 09:49:35 ET T: 06/19/2011 11:47:52 ET JOB#: [id number] cc: Tammy HammingSnehalatha Anzlee Hinesley, MD, <Dictator> Tammy LeavensJames F. Burnett ShengHedrick, MD Tammy HammingSNEHALATHA Kataleia Quaranta MD ELECTRONICALLY SIGNED 06/25/2011  11:11 

## 2014-05-23 DIAGNOSIS — L03113 Cellulitis of right upper limb: Secondary | ICD-10-CM | POA: Diagnosis not present

## 2014-05-27 NOTE — Consult Note (Signed)
   Comments   Goals of care meeting had with daughter Thurston Holenne.  Had states that since last admission pt completed her rehab days and went to [facility].  Decision was made after that not to have hospice services and have home health follow.  Daughter states at baseline pt is on 3L [place] and has SOB with any extensive work.  Pt can dress herself, needs help with bathing and cooking.  Pt can feed herself.  Pt over last month have had more episodes of incontinence.  Daughter understands pt is ill and will need to see how pt responds to IV antibiotics and steroids.  Daughter states her goal is for pt to discharge with rehab services.  Daughter understands pt prognosis is guarded due to underlying conditions.  Daughter and pt confirm DNR. states 1 mg of morphine worked well for her SOB.  Pt requests this as PRN order, "but not too often"  Will schedule PRN q4H and adjust frequency as indicated.  Comments   1. Morphine 1mg  q4H prn SOB  Electronic Signatures: Reather LaurenceMantzouris, Freedom Lopezperez J (NP)  (Signed 04-Jan-16 12:03)  Authored: Palliative Care Phifer, Harriett SineNancy (MD)  (Signed 04-Jan-16 16:02)  Authored: Palliative Care   Last Updated: 04-Jan-16 16:02 by Phifer, Harriett SineNancy (MD)

## 2014-05-27 NOTE — Consult Note (Signed)
   Comments   Jeneen Rinks, NP, and I met with pt's daughter, Webb Silversmith, pt's [place]. Daughter says that pt has been a resident at [facility] since 2002. She feels that pt has been happy there and pt is happy with the hospice RN who follows her at SNF. I recommend pt returning to [facility] as this is her home. Daughter accepts that pt should have an order to avoid recurrent hospitalizations and is in agreement with this. asks that we speak with pt's other daughter to answer any questions that she might have. Will speak with her when available.   Electronic Signatures: Maylene Crocker, Izora Gala (MD)  (Signed 04-Apr-16 17:34)  Authored: Palliative Care   Last Updated: 04-Apr-16 17:34 by Jlee Harkless, Izora Gala (MD)

## 2014-05-27 NOTE — H&P (Signed)
PATIENT NAME:  [name], Beonca G MR#:  [id number] DATE OF BIRTH:  [birth year]  DATE OF ADMISSION:  04/27/2014  REFERRING DOCTOR: Chiquita LothJade Sung  PRIMARY CARE PRACTITIONER: Karie Schwalbeichard I. Letvak, MD  ADMITTING PHYSICIAN: Crissie FiguresEdavally N. Peregrine Nolt, MD   CHIEF COMPLAINT: Found to have low oxygen saturations at nursing home.  HISTORY OF PRESENT ILLNESS: A 81 year old Caucasian female resident of [facility] nursing [facility] on DNR status was noted to have low oxygen saturations on her usual oxygen at the nursing home, hence EMS brought the patient to the Emergency Room. In the Emergency Room on arrival the patient was noted to have oxygen saturations in the upper 80s and was noted to have respiratory distress and was placed on oxygen supplementation, and workup revealed a chest x-ray significant for right pleural effusion and also persistent right lower lobe pneumonia. Blood cultures were obtained and the patient was continued on oxygen supplementation and was started on IV antibiotics and hospitalist team was consulted for further management. The patient is basically bedbound. The patient on DNR status at nursing home and stated that she has not been feeling well for the past few days and has been treated for pneumonia with oral Levaquin. Denies any chest pain. No shortness of breath at this time. No nausea, vomiting, diarrhea. No urinary symptoms.   PAST MEDICAL HISTORY:  1.  COPD on home oxygen 2 L.  2.  Hypertension.  3.  Congestive heart failure.  4.  Coronary artery disease/NSTEMI.  5.  Anxiety/depression.   PAST SURGICAL HISTORY: 1.  Bladder surgery.  2.  Appendectomy.  3.  Hysterectomy.  4.  Bilateral cataract surgery.   ALLERGIES: 1.  SULFA.  2.  PENICILLIN.  3.  BACTRIM.  4.  LACTULOSE.  5.  TAPE.   HOME MEDICATIONS: 1.  Alprazolam 0.5 mg tablet 2 times a day and as needed.  2.  Aspirin 81 mg, 1 tablet orally once a day.  3.  Celexa 10 mg, 1 tablet orally once a day.  4.  Saline nasal spray as  needed.  5.  Dulera 5/100 mcg inhalation aerosol 2 times a day.  6.  DuoNeb 2.5/0.5 mg 4 times a day inhalation.  7.  Isosorbide mononitrate 60 mg extended-release tablet, 1 tablet orally once a day. 8.  Losartan 50 mg, 1 tablet orally once a day.  9.  Meclizine as needed.  10.  Morphine extended release 15 mg/24 hour oral tablet extended, 1 tablet every 12 hours.  11.  Nitroglycerin 0.4 mg sublingually, 1 tablet every 5 minutes as needed for chest pain for 3 doses.  12.  Pantoprazole 40 mg, 1 tablet orally once a day.  13.  Potassium chloride 10 mEq, 1 tablet orally once a day.  14.  Propranolol 20 mg tablet, 1 tablet orally 2 times a day.  15.  Robitussin 10 mL every 4 hours as needed for cough.  16.  Roxanol 10 mg orally every hour as needed.  17.  Senokot 8.6 mg oral tablet 1 tablet 2 times a day.  18.  Tylenol as needed.  19.  Zofran as needed.  SOCIAL HISTORY: She is a resident of [facility]. She is basically bed bound. She is a former smoker, quit many years ago. No history of alcohol or illicit drug usage.   FAMILY HISTORY: Positive for CVA in her father and coronary artery disease in her mother. Both parents had hypertension.  REVIEW OF SYSTEMS:  CONSTITUTIONAL: Negative for fever, fatigue, generalized  weakness.  EYES: Negative for blurred vision, double vision. No pain. No redness. No discharge.  EARS, NOSE, AND THROAT: Negative for tinnitus, ear pain, hearing loss.  RESPIRATORY: Positive for shortness of breath with dyspnea with low saturations. Positive for cough. Negative for wheezing. No hemoptysis. No painful respiration.  CARDIOVASCULAR: Negative for chest pain, palpitation, dizziness, syncopal episodes, orthopnea, or pedal edema.  GASTROINTESTINAL: Negative for nausea, vomiting, diarrhea, abdominal pain, hematemesis, melena, or rectal bleeding.  GENITOURINARY: Negative for dysuria, frequency, or urgency.  ENDOCRINE: Negative for polyuria, nocturia, heat or  cold intolerance.  HEMATOLOGIC AND LYMPHATIC: Negative for anemia, easy bruising or bleeding, or swollen glands.  INTEGUMENTARY: Negative for acne, skin rash, or lesions.  MUSCULOSKELETAL: Negative for neck or back pain. No history of arthritis or gout.  NEUROLOGICAL: Negative for focal weakness or numbness. No history of CVA, TIA, or seizure disorder.  PSYCHIATRIC: Positive for history of depression/anxiety, stable on home medications.   PHYSICAL EXAMINATION:  VITAL SIGNS: Temperature 98.7 degrees Fahrenheit, pulse rate 99 per minute, respirations 22 per minute, blood pressure 146/72, oxygen saturations 95% on 2 L oxygen. GENERAL: Well developed, well nourished, alert, in no acute distress, comfortably resting in the bed.  HEAD: Atraumatic, normocephalic.  EYES: Pupils equal, react to light and accommodation. No conjunctival pallor. No icterus. Extraocular movements intact.  NOSE: No drainage. No lesions.  EARS: No drainage. No external lesions. ORAL CAVITY: No mucosal lesions. No exudates.  NECK: Supple. No JVD. No thyromegaly. No carotid bruit. Range of motion of the neck is within normal limits. LUNGS: Good respiratory effort. Not using accessory muscles of respiration. Bilateral vesicular breath sounds present. A few rales at both bases. No rhonchi.  CARDIOVASCULAR: S1, S2 regular. No murmurs, gallops, or clicks. Pulses equal at carotid, femoral, pedal pulses. No peripheral edema.  GASTROINTESTINAL: Abdomen soft, nontender. No hepatosplenomegaly. No masses noted. There is n o guarding. Bowel sounds present and equal in all 4 quadrants.  GENITOURINARY: Deferred.  MUSCULOSKELETAL: No joint tenderness or effusion. Range of motion adequate. Strength and tone equal bilaterally.  SKIN: Inspection within normal limits.  LYMPHATIC: No cervical lymphadenopathy.  VASCULAR: Good dorsalis pedis and posterior tibial pulses.  NEUROLOGIC: Alert, awake, and oriented x 3. Cranial nerves II through XII  grossly intact. No sensory deficit. Motor strength 5/5 in both lower extremities. DTRs 2+ bilaterally, symmetrical.  PSYCHIATRIC: Alert, awake, and oriented x 3. Judgment and insight adequate. Memory and mood within normal limits.   ANCILLARY DATA:   LABORATORY DATA: BNP 188, serum glucose 148, BUN 20, creatinine 0.98, sodium 131, potassium 3.2, chloride 87, bicarbonate 35, total calcium 8.8, total CK 117, CK-MB 0.8. Troponin less than 0.03. WBC 7.2, hemoglobin 10.6, hematocrit 32.3, platelet count 201,000.   IMAGING STUDIES: Chest x-ray: Persistent small right pleural effusion, perhaps slightly improved from the prior study, with right basilar airspace opacification. This raises concern for persistent pneumonia.   CT, chest: 1.  Small to moderate right-sided pleural effusion with underlying consolidation or collapse of much of the right lower lobe. This could reflect pneumonia. Vague 1.2 cm focus of ground glass opacity within the left upper lobe is of uncertain significance and may reflect infection. No definite evidence of mass.  2.  Small left pleural effusion.  3.  Underlying diffuse emphysematous changes more prominent on the right.  4.  Mild scarring of the lung apices.   EKG: Normal sinus rhythm with ventricular rate of 95 beats per minute. No acute ST-T changes.   ASSESSMENT AND  PLAN: A 81 year old Caucasian female, a nursing home resident on do not resuscitate status, brought in with complaints of found to have low oxygen saturations at nursing home and history of ongoing cough, shortness of breath secondary due to pneumonia and found to have right lower lobe persistent pneumonia on chest x-ray and CT, chest. 1.  Right lower lobe pneumonia, which is persistent and failed oral treatment with Levaquin. Plan: Admit to medical floor. Oxygen supplementation. Blood and sputum cultures obtained. Continue intravenous antibiotics of vancomycin and Zosyn. Follow up labs and cultures.  2.  Acute on  chronic hypoxic respiratory failure secondary due to pneumonia. Plan: As above. 3.  Chronic obstructive pulmonary disease. Stable on home medications. Continue home medications, nebulizer treatments, and oxygen supplementation, and follow up oxygen saturations.  4.  Hypokalemia, mild. Plan: Replace potassium. Follow BMP.  5.  Hypertension. Stable on home medications. Continue same.  6.  Coronary artery disease. Stable on medical management. Continue home medications.  7.  History of anxiety, depression. Stable on home medication. Continue same.  8.  Deep vein thrombosis prophylaxis. Subcutaneous Lovenox.  9.  Gastrointestinal prophylaxis. Proton pump inhibitor.   CODE STATUS: Do not resuscitate.   TIME SPENT: 50 minutes.   ____________________________ Crissie Figures, MD enr:ST D: 04/27/2014 03:22:19 ET T: 04/27/2014 04:23:07 ET JOB#: [id number]  cc: Crissie Figures, MD, <Dictator> Karie Schwalbe, MD  Crissie Figures MD ELECTRONICALLY SIGNED 04/27/2014 18:28

## 2014-05-27 NOTE — Discharge Summary (Signed)
PATIENT NAME:  [name], Kemba G MR#:  [id number] DATE OF BIRTH:  [birth year]  DATE OF ADMISSION:  04/27/2014 DATE OF DISCHARGE:  05/02/2014  DISCHARGE DIAGNOSES: 1. Acute on chronic respiratory failure due to pneumonia and chronic obstructive pulmonary disease exacerbation.  2. Acute diastolic heart failure.  3. Hypertension.  4. Hypokalemia.  5. Weakness.  6. History of coronary artery disease.  7. Anxiety.   MEDICATIONS ON DISCHARGE:  Dulera 2 puff inhalation 2 times a day. 1. Losartan 50 mg oral tablet once a day.  2. DuoNeb 3 mL inhalation 4 times a day.  3. Nitroglycerin 0.4 mg sublingual tablet every 5 minutes up to 3 doses, as needed for chest pain.  4. Potassium chloride 10 mEq oral tablet once a day.  5. Pantoprazole 40 mg oral delayed-release once a day.  6. Senna 8.6 mg oral tablet 2 times a day.  7. Aspirin enteric-coated 81 mg delayed-release every other day.  8. Morphine extended-release 15 mg 2 times a day.  9. Tylenol 325 mg oral tablet 2 tablets every 4 hours as needed.  10. Zofran 1 tablet every 8 hours as needed.  11. Nystatin 5 mL oral 3 times a day.  12. Artificial Tears 1 drop each affected eye 4 hours as needed.  13. Celexa 10 mg oral tablet once a day.  14. Propanolol 20 mg oral tablet 2 times a day.  15. Alprazolam 0.25 mg oral tablet every 8 hours as needed for anxiety  16. Prednisone 10 mg tablets, start at 60 and taper by 10 mg until complete.  17. Cardizem 120 mg extended-release tablet once a day.  18. Ceftin 500 mg 2 times a day for 7 days.  19. Azithromycin 500 mg oral for 5 days.   DIET ON DISCHARGE: Advised to have a low-sodium, low-fat, and low-cholesterol diet.   HOME OXYGEN: With 2 liters nasal cannula oxygen supplementation, continuously.   FOLLOWUP INSTRUCTIONS: Advised to follow with primary care physician, Dr. Tillman Abideichard Letvak, within a week or 2. Also arrange for hospice nurse follow-up at nursing home for the patient.   HISTORY OF  PRESENTING ILLNESS: A 81 year old female, a resident of [facility] facility and DO NOT RESUSCITATE status, noted to have low oxygen saturation and somewhat drowsy, so sent to Emergency Room. She was on respiratory distress, placed on oxygen supplementation. Work-up showed there is a pneumonia and possibly, some pleural effusions on chest x-ray. The patient had repeated episodes of pneumonia in the last 3-4 months, so placed on broad-spectrum antibiotic.   HOSPITAL COURSE AND STAY:  1. Acute on chronic respiratory failure because of pneumonia. The patient had recurrent episodes of pneumonia and because she came from hospital and nursing home so repeatedly, we started on broad-spectrum antibiotics with vancomycin and Zosyn. Cultures were sent, which remained negative. We also did speech and swallow evaluation studies, and diet recommendation as per the swallow evaluation technician. The patient remained fine and gradually came on nasal cannula baseline oxygen 2 liters and so, we were able to discharge her back to the nursing home with extended course, almost up to 2 weeks of antibiotic total, to prevent recurrent pneumonias. Also explained to her daughters about getting old age conditions and got palliative consult about this. Family agreed about hospice, and they might not be bringing her back for readmission if she gets worse.  2. Episode of altered mental status and severe respiratory distress. She had 1 episode on April 3, of severe respiratory distress and altered mental  status. We had to put her on nonrebreather mask for that. She responded to nebulizer, IV steroid, and Lasix therapy and gradually improved better, and continuously alert and oriented after that.  3. Acute on chronic diastolic congestive heart failure. Given IV Lasix and she improved.  4. Hypokalemia. Replaced orally and IV.  5. Tachycardia with elevated troponin. Troponin went up to 0.1, but after that, it came down. So, we gave 1 dose of  Lovenox, but then there was no need for further cardiac work-up. She had a history of coronary artery disease. We just continued the baseline medications, as troponin did not rise any further and her heart rate remained under control after that, after starting her on Cardizem.  6. History of anxiety. We gave Xanax on as-needed basis and advised to give it, but to be cautious about making her very drowsy.   CONSULTATIONS IN HOSPITAL: Pulmonary, Dr. Belia Heman.   IMPORTANT LABORATORY RESULTS: BNP 188. CK 17. WBC 7.2, hemoglobin 10.6, and platelet count 201,000 on admission. Creatinine 0.98, sodium 131, and potassium 3.2 on admission. Blood culture negative on admission. Chest x-ray PA and lateral showed a persistent small right pleural effusion, slightly improved, slight opacification.   CT scan of the chest without contrast: Small to moderate right-sided pleural effusion, underlying, consolidation and collapse reflect pneumonia. Small left pleural effusion also noted, underlying diffuse emphysematous changes, a 2 cm node in left thyroid.   Blood cultures were negative. Troponin level went up to 0.11, but came 0.08 on further follow-up. Creatine 0.95, sodium 134, and potassium 3.0.   TOTAL TIME SPENT ON THIS DISCHARGE: 40 minutes.     ____________________________ Hope Pigeon Elisabeth Pigeon, MD vgv:mw D: 05/02/2014 13:17:25 ET T: 05/02/2014 13:35:38 ET JOB#: [id number]  cc: Hope Pigeon. Elisabeth Pigeon, MD, <Dictator> Karie Schwalbe, MD Altamese Dilling MD ELECTRONICALLY SIGNED 05/03/2014 0:43

## 2014-05-27 NOTE — Discharge Summary (Signed)
PATIENT NAME:  [name], [name] MR#:  [id number] DATE OF BIRTH:  [birth year]  DATE OF ADMISSION:  01/28/2014 DATE OF DISCHARGE:  02/01/2014  For a detailed note, please see the history and physical done on admission by Dr. Elby Showers.   DIAGNOSES AT DISCHARGE: As follows:  1.  Acute on chronic respiratory failure secondary to chronic obstructive pulmonary disease exacerbation.  2.  Chronic obstructive pulmonary disease exacerbation secondary to pneumonia secondary the patient pneumonia.   3.  Pneumonia secondary to pseudomonas. 4.  Hypertension.  5.  Hyponatremia. 6.  Chronic anxiety. 7.  Chronic pain.   DISCHARGE INSTRUCTIONS: The patient is being discharged on a low-sodium, low-fat diet with strict aspiration precautions, thin liquids, medication whole and pureed with applesauce, meats cut well and moistened to avoid increased mastication.   DISCHARGE ACTIVITY: As tolerated.   FOLLOW-UP:  In the next 1 to 2 weeks with Dr. Tera Mater.    DISCHARGE MEDICATIONS:  Dulera 2 puffs b.i.d., propranolol 20 mg b.i.d., losartan 50 mg daily, meclizine 25 mg t.i.d. as needed, DuoNebs 4 times daily, nitroglycerin sublingual every five minutes as needed, MiraLax daily as needed, magnesium gluconate 250 mg b.i.d., calcium vitamin D 1 tab b.i.d., nystatin swish and swallow 5 mL 4 times daily.  Lysine 500 mg daily, Xanax 0.24 mg every 4 hours as needed, potassium 10 mEq daily, Imdur 60 mg daily, Lasix 20 mg daily, Protonix 40 mg daily, trimethoprim 100 mg daily, Senokot 8.6 mg 1 tab b.i.d., ipratropium inhaler 2 sprays to each nostril daily, albuterol inhaler 2 puffs 4 times daily as needed, aspirin 81 mg daily, morphine extended release 15 mg b.i.d., Robitussin 10 mL every 4 hours as needed for cough, prednisone taper starting at 60 mg down to 10 mg over the next 6 days and ciprofloxacin 5 mg b.i.d. x 10 days.   CONSULTANTS DURING THE HOSPITAL COURSE: Dr. Harriett Sine Phifer, palliative care.  PERTINENT  STUDIES DONE DURING THE HOSPITAL COURSE: A CT chest done without contrast showing right lower lobe pneumonia approximately 1.4 cm and 0.9 cm nodules with irregular margins, suspicious for malignancy, severe chronic obstructive pulmonary disease, emphysema, multinodular goiter.   Chest x-ray done on admission showing prominent emphysema.   HOSPITAL COURSE: This is a 81 year old female with medical problems as mentioned above, presented to the hospital with shortness of breath and hypoxia noted to be in acute on chronic respiratory failure.  1.  Acute on chronic respiratory failure. This was likely secondary to chronic obstructive pulmonary disease exacerbation and from pneumonia. The patient was admitted to this hospital, started on IV steroids, also was placed on around-the-clock nebulizer treatments, maintained on her Dulera.  She was also given empiric IV antibiotics initially with Zosyn and Levaquin. The patient, after getting aggressive therapy for the past 3 to 4 days, has improved with less wheezing and bronchospasm.  Her IV steroids have been tapered now to oral prednisone taper. She will also continue her DuoNebs around-the-clock, along with the [facility] and her O2 supplementation. She is already on 2 to 3 liters of oxygen at home chronically.  2.  Chronic obstructive pulmonary disease exacerbation. This was likely secondary to pneumonia. This was likely Pseudomonas pneumonia. The patient's sputum culture grew out Pseudomonas. The patient has been treated adequately with IV steroids, around-the-clock nebulizer treatments, Dulera and IV antibiotics and now switched over to oral antibiotics.  At this point, she is being discharged on oral Cipro for her pseudomonas pneumonia. She was also seen by palliative  care for symptom management, receive some IV morphine for air hunger, which seemed to help her.  She is already on long acting morphine for chronic pain. She will continue that for now, along with her  maintenance inhalers and steroids, as mentioned.   3.  Lung nodules.  The patient was noted to have small lung nodules on CT chest. Given her advanced age and her DO NOT RESUSCITATE status, we will avoid aggressive work-up. We can follow these with serial imaging as an outpatient in the next 3 to 6 months.  4.  Anxiety. The patient was maintained on Xanax. She will resume that.   5.  Hyponatremia. This is likely chronic for the patient, probably related to mild syndrome of inappropriate antidiuretic hormone, also complicated with mild hypovolemic hyponatremia.  The patient's sodium on admission was 124 and it went up to as high as 132.  On the day of discharge it is 129. She is clinically asymptomatic with this.  I am taking her off the amiloride hydrochlorothiazide.  Continuing her Lasix for now.  Some of her diuretics could have also contributed to her hyponatremia which is why I am taking her off the amiloride hydrochlorothiazide.  6.  Hypertension. The patient remained hemodynamically stable. She will continue her propranolol Imdur and losartan.  7.  Gastroesophageal reflux disease.  The patient was maintained on her Protonix. She will resume that.  8.  Chronic pain. The patient will continue her MS Contin.  9.  Chronic constipation. This is secondary to her use of narcotics. She will continue her MiraLax and Senokot as stated.   CODE STATUS: The patient is a DO NOT INTUBATE, DO NOT RESUSCITATE.   DISPOSITION: She is being discharged to a skilled nursing facility for ongoing care.   TIME SPENT: 40 minutes.     ____________________________ Rolly PancakeVivek J. Cherlynn KaiserSainani, MD vjs:DT D: 02/01/2014 12:31:21 ET T: 02/01/2014 13:22:29 ET JOB#: [id number]  cc: Rolly PancakeVivek J. Cherlynn KaiserSainani, MD, <Dictator> Rhona LeavensJames F. Burnett ShengHedrick, MD Houston SirenVIVEK J SAINANI MD ELECTRONICALLY SIGNED 02/20/2014 11:48

## 2014-05-27 NOTE — Consult Note (Signed)
   Comments   Lengthy family meeting held with pt and her two daughters.  Explained pt's underlying conditions and prognosis going forward.  Pt states she wants comfort and family agrees.  Pt agrees for me to fill out MOST form stating (DNR, Comfort measures stating Do Not Rehospitalize, may give antibiotics, no IV fluid, no trach/PEG) and to have daughter, Thurston Holenne, who is HCPOA sign it for her.  [facility]. form completed, SW updated.  Electronic Signatures: Paytyn Mesta, Jaquelyn BitterStephen J (NP)  (Signed 05-Apr-16 12:01)  Authored: Palliative Care   Last Updated: 05-Apr-16 12:01 by Reather LaurenceMantzouris, Layken Doenges J (NP)

## 2014-05-27 NOTE — H&P (Signed)
PATIENT NAME:  [name], [name] MR#:  [id number] DATE OF BIRTH:  April 02, 1921  DATE OF ADMISSION:  01/28/2014  REFERRING EMERGENCY ROOM PHYSICIAN: Gladstone Pih, MD  PRIMARY CARE PHYSICIAN: Rhona Leavens. Burnett Sheng, MD   CHIEF COMPLAINT: Shortness of breath and hypoxia noted at living facility.   HISTORY OF PRESENT ILLNESS: This very pleasant 81 year old woman with a past medical history of congestive heart failure, COPD with chronic respiratory failure on 2.5 to 3 L of oxygen, paroxysmal atrial fibrillation, coronary artery disease, presents today from [facility] rehabilitation facility due to shortness of breath and oxygenation of 85% on 2.5 liters. She reports that she presented to the Emergency Room on December 23 and was diagnosed with pneumonia, treated with Levaquin. She has continued to be weak, lethargic, somewhat confused since that time. She has had improving shortness of breath and cough but today was feeling acutely worse. She denies fevers or chills. No nausea, vomiting, or diarrhea. No wheezing. She is producing thick yellow sputum. No hemoptysis. On presentation to the Emergency Room, she continues to be fairly hypoxic. Presenting oxygen saturation was 86% which is improved to 96% after nebulizers and increasing oxygen to 4 liters. CT scan shows right lower lobe pneumonia, severe COPD, and also a new 1.4 cm solid nodule in the left upper lobe. Hospitalist services are asked to admit for further evaluation and treatment.  PAST MEDICAL HISTORY:  1.  Congestive heart failure. A 2-D echocardiogram June 2015 showing mild diastolic and systolic dysfunction. It was a poor-quality study and there is no ejection fraction estimate.  2.  COPD.  3.  Chronic respiratory failure on 2.5 to 3 L of oxygen at home.  4.  Bladder prolapse requiring her to stand for urination.  5.  Herniated disk, inoperable, causing chronic pain.  6.  Anxiety.  7.  Atrial fibrillation.  8.  Coronary artery disease with  history of NSTEMI.   9.  History of diverticulosis. 10.  History of hyperthyroidism.  11.  History of hypertension.  12.  Osteoarthritis.   PAST SURGICAL HISTORY:  1.  Bladder surgery.  2.  Appendectomy.  3.  Total abdominal hysterectomy.  4.  Cataract surgery.   SOCIAL HISTORY: The patient lives at [facility]. She has recently been in the rehabilitation area for the past 10 days. She is usually able to ambulate with a walker, but more recently has had loss of balance and weakness and has been less mobile. She is usually very clear and performs all of her ADLs but recently has been more confused. She is a former smoker; quit many years ago. She does not drink alcohol or use illicit substances. She is accompanied today by her daughter, who provides most of the history.   FAMILY MEDICAL HISTORY: Positive for CVA in her father and coronary artery disease in her mother. Both parents had hypertension.   ALLERGIES: SULFA DRUGS, PENICILLIN, BACTRIM, LACTOSE, AND ADHESIVE TAPE.   REVIEW OF SYSTEMS:  CONSTITUTIONAL: Positive for fevers, fatigue, and weakness. Positive for chronic pain. No change in weight.  HEENT: No change in vision or hearing. No pain in eyes or ears. No difficulty swallowing. She does have some postnasal drip. No sinus congestion. No sore throat.  RESPIRATORY: Positive for cough, wheezing. No hemoptysis. She is producing sputum. She is dyspneic. No painful respirations. She does have a history of COPD.  CARDIOVASCULAR: No chest pain, orthopnea, edema, or palpitations.  GASTROINTESTINAL: She has had some nausea. No vomiting, diarrhea, or abdominal pain.  GENITOURINARY: Negative for dysuria or frequency. Positive for history of prolapsed bladder, requiring standing for urination.  HEMATOLOGIC: No easy bruising or bleeding.  MUSCULOSKELETAL: No new pain in the neck, back, shoulders, knees, or hips. She has chronic low back pain which is severe and debilitating. Positive for  osteoarthritis. No gout.  NEUROLOGIC: No focal numbness or weakness. No dysarthria. No history of CVA or seizure. No history of dementia. No headache.  PSYCHIATRIC: She does have a history of anxiety and depression which are well controlled. No bipolar disorder or schizophrenia.   HOME MEDICATIONS:  1.  Trimethoprim 100 mg 1 tablet once a day.  2.  Senokot 8.6 mg 1 tablet twice a day.  3.  Propranolol 20 mg 1 tablet twice a day.  4.  ProAir HFA CFC free 90 mcg per inhalation 2 puffs inhaled every 4 hours as needed for shortness of breath.  5.  Potassium chloride 10 mEq 1 tablet once a day.  6.  Pantoprazole 40 mg 1 tablet once a day.  7.  Nystatin 100,000 units/mL swish and swallow 5 mL 4 times a day.  8.  Nitroglycerin 0.4 mg 1 tablet sublingual every 5 minutes up to 3 doses as needed for chest pain. 9.  Morphine 15 mg per 12 hour oral extended release tablet 1 tablet every 12 hours. 10.  MiraLax 17 g once a day in the morning.  11.  Meclizine 25 mg 1 tablet 3 times a day as needed for dizziness. 12.  Magnesium gluconate 250 mg 1 tablet twice a day.  13.  Lysine 500 mg 1 tablet once a day.  14.  Losartan 50 mg 1 tablet once a day.  15.  Isosorbide mononitrate 60 mg 1 tablet once a day.  16.  Ipratropium nasal spray 21 mcg per inhalation 2 sprays nasally once a day in the morning.  17.  Furosemide 20 mg 1 tablet once a day.  18.  DuoNeb 2.5 mg, 0.5 mg/3 mL, 3 mL inhaled 4 times a day as needed for shortness of breath.  19.  Dulera 5/100 mcg per inhalation 2 puffs twice a day.  20.  Calcium and vitamin D 1 tablet twice a day.  21.  Aspirin 81 mg 1 tablet every other day.  22.  Amiloride/hydrochlorothiazide 5/50 mg 1 tablet once a day in the morning.  23.  Alprazolam 0.25 mg 1 tablet every 4 hours as needed for anxiety or shortness of breath.   PHYSICAL EXAMINATION:  VITAL SIGNS: Temperature 98.2, pulse 83, respirations 22, blood pressure 105/48, oxygenation 96% on 4 L.  GENERAL: The  patient appears acutely ill.  HEENT: Pupils equal, round, and reactive to light. Conjunctivae clear. Extraocular motion intact. Oral mucous membranes are pink and moist. No thrush is noted. Posterior oropharynx is clear with no exudate, edema, or erythema. Fair dentition. No signs of infection.  NECK: Supple. Trachea midline. No cervical lymphadenopathy.  RESPIRATORY: There are diffuse rhonchi and wheezes with poor air movement. Rapid shallow respirations.  CARDIOVASCULAR: Regular rate and rhythm. No murmurs, rubs, or gallops. No peripheral edema. Peripheral pulses are 1+.  ABDOMEN: Soft, nontender, nondistended. No guarding, no rebound, no mass. She does have some discomfort when I palpate the suprapubic area. Says this is due to her bladder and is chronic. Bowel sounds are normal.  MUSCULOSKELETAL: No joint effusions. Range of motion is normal. Strength is diffusely weak, 4+/5 throughout.  NEUROLOGIC: Cranial nerves II through XII intact. Strength and sensation are symmetric and  intact.  PSYCHIATRIC: The patient is alert and oriented with good insight into her clinical condition. She does not seem anxious at this time. She seems very fatigued.   LABORATORY DATA: Sodium 124, potassium 4.3, chloride 82, bicarbonate 34, BUN 31, creatinine 1.31, glucose 112. White blood cells 15.8, hemoglobin 11.3, platelets 176,000, MCV is 98.   ASSESSMENT AND PLAN:  1.  Healthcare-acquired pneumonia which has been resistant to Levaquin in the institutional setting: Will get blood cultures and sputum cultures. Will start vancomycin and cefepime, as she is allergic to penicillins. The pneumonia is a right lower lobe pneumonia, questionable for aspiration, as well as it seems to be either not responding to treatment or recurrent. Will get a speech therapy evaluation for swallowing. 2.  New pulmonary nodule seen on CT scan with features suggestive of malignancy: I have discussed the presence of the nodule with the patient  and with her daughter. No further workup would be appropriate at this time. I do not think that this 1.4 cm nodule is causing her respiratory distress, though it may be contributing to her hyponatremia.  3.  Hyponatremia: Sodium 124, which is decreased from prior value 1 week ago at 132, but she has been hyponatremic in the past as well. I suspect this is most likely due to decreased p.o. intake and will replete gently with normal saline. This may also be related to syndrome of inappropriate secretion of antidiuretic hormone and will check a urine sodium level.  4.  Congestive heart failure: This seems to be a mild combined diastolic and systolic dysfunction by 2-D echocardiogram June 2015. She does not seem to be in frank congestive heart failure exacerbation. There is no edema. Chest x-ray shows no pulmonary edema. Will check a BNP.  5.  Acute renal failure: The patient's creatinine has gone from 1.13 to 1.31. We will hydrate gently and hold nephrotoxic agents at this time.  6.  Acute on chronic respiratory failure: She usually uses 2.5 to 3 L of oxygen to keep oxygen saturations in the 90s and she is currently on 4 L. This is likely due to pneumonia. 7.  Chronic obstructive pulmonary disease: She does have some current wheezing which may be contributing to her respiratory failure. Will continue with antibiotics, nebulizers, home inhalers, and start steroids as well.  8.  Herniated disk: Will continue with pain control. 9.  Do not resuscitate: I discussed the patient's "do not resuscitate" status with both the patient and her daughter at the bedside, and they confirmed that she does not wish for cardiopulmonary resuscitation.  10.  Prophylaxis: Heparin for deep vein thrombosis prophylaxis. Protonix for gastrointestinal prophylaxis.  TIME SPENT ON ADMISSION: 55 minutes.   ____________________________ Ena Dawleyatherine P. Clent RidgesWalsh, MD cpw:ST D: 01/28/2014 21:18:46 ET T: 01/28/2014 21:35:05  ET JOB#: [id number]  cc: Santina Evansatherine P. Clent RidgesWalsh, MD, <Dictator> Gale JourneyATHERINE P Diany Formosa MD ELECTRONICALLY SIGNED 02/06/2014 13:18

## 2014-06-01 DIAGNOSIS — Z9981 Dependence on supplemental oxygen: Secondary | ICD-10-CM | POA: Diagnosis not present

## 2014-06-01 DIAGNOSIS — J441 Chronic obstructive pulmonary disease with (acute) exacerbation: Secondary | ICD-10-CM | POA: Diagnosis not present

## 2014-06-01 DIAGNOSIS — R918 Other nonspecific abnormal finding of lung field: Secondary | ICD-10-CM | POA: Diagnosis not present

## 2014-06-01 DIAGNOSIS — J9610 Chronic respiratory failure, unspecified whether with hypoxia or hypercapnia: Secondary | ICD-10-CM | POA: Diagnosis not present

## 2014-06-13 DIAGNOSIS — K219 Gastro-esophageal reflux disease without esophagitis: Secondary | ICD-10-CM

## 2014-06-13 DIAGNOSIS — F419 Anxiety disorder, unspecified: Secondary | ICD-10-CM

## 2014-06-13 DIAGNOSIS — I2510 Atherosclerotic heart disease of native coronary artery without angina pectoris: Secondary | ICD-10-CM

## 2014-06-13 DIAGNOSIS — M545 Low back pain: Secondary | ICD-10-CM

## 2014-06-13 DIAGNOSIS — I5030 Unspecified diastolic (congestive) heart failure: Secondary | ICD-10-CM

## 2014-06-13 DIAGNOSIS — I4891 Unspecified atrial fibrillation: Secondary | ICD-10-CM

## 2014-06-13 DIAGNOSIS — G250 Essential tremor: Secondary | ICD-10-CM

## 2014-06-18 ENCOUNTER — Telehealth: Payer: Self-pay | Admitting: Pulmonary Disease

## 2014-06-18 NOTE — Telephone Encounter (Signed)
Will forward to Dr. McQuaid as FYI 

## 2014-06-19 NOTE — Telephone Encounter (Signed)
Will refill meds as needed if pt calls for this.

## 2014-06-19 NOTE — Telephone Encounter (Signed)
I'm OK with med refills for her in the meantime if needed

## 2014-08-06 DIAGNOSIS — I5032 Chronic diastolic (congestive) heart failure: Secondary | ICD-10-CM | POA: Diagnosis not present

## 2014-08-06 DIAGNOSIS — J439 Emphysema, unspecified: Secondary | ICD-10-CM | POA: Diagnosis not present

## 2014-08-06 DIAGNOSIS — K219 Gastro-esophageal reflux disease without esophagitis: Secondary | ICD-10-CM

## 2014-08-06 DIAGNOSIS — I25119 Atherosclerotic heart disease of native coronary artery with unspecified angina pectoris: Secondary | ICD-10-CM | POA: Diagnosis not present

## 2014-08-06 DIAGNOSIS — F39 Unspecified mood [affective] disorder: Secondary | ICD-10-CM

## 2014-08-06 DIAGNOSIS — I482 Chronic atrial fibrillation: Secondary | ICD-10-CM | POA: Diagnosis not present

## 2014-08-09 DIAGNOSIS — R05 Cough: Secondary | ICD-10-CM | POA: Diagnosis not present

## 2014-08-27 DIAGNOSIS — J9610 Chronic respiratory failure, unspecified whether with hypoxia or hypercapnia: Secondary | ICD-10-CM

## 2014-08-27 DIAGNOSIS — J441 Chronic obstructive pulmonary disease with (acute) exacerbation: Secondary | ICD-10-CM

## 2014-08-27 DIAGNOSIS — Z9981 Dependence on supplemental oxygen: Secondary | ICD-10-CM

## 2014-08-27 DIAGNOSIS — R918 Other nonspecific abnormal finding of lung field: Secondary | ICD-10-CM

## 2014-09-07 DIAGNOSIS — B309 Viral conjunctivitis, unspecified: Secondary | ICD-10-CM

## 2014-10-02 ENCOUNTER — Telehealth: Payer: Self-pay

## 2014-10-02 ENCOUNTER — Telehealth: Payer: Self-pay | Admitting: Family Medicine

## 2014-10-02 NOTE — Telephone Encounter (Signed)
PLEASE NOTE: All timestamps contained within this report are represented as [place] Standard Time. CONFIDENTIALTY NOTICE: This fax transmission is intended only for the addressee. It contains information that is legally privileged, confidential or otherwise protected from use or disclosure. If you are not the intended recipient, you are strictly prohibited from reviewing, disclosing, copying using or disseminating any of this information or taking any action in reliance on or regarding this information. If you have received this fax in error, please notify us immediately by telephone so that we can arrange for its return to [place]. Phone: [phone], Toll-Free: [phone], Fax: [phone] Page: 1 of 1 Call Id: [id number] [facility] Night - Client TELEPHONE ADVICE RECORD [facility] Medical Call Center Patient Name: [name] Gender: Female DOB: [birth year] Age: 81 Y 2 M 12 D Return Phone Number: [phone] (Primary) Address: City/State/Zip: [place] Client [facility] Night - Client Client Site [place] [facility] [place] - Night Physician Tillman Abide Contact Type Call Call Type Page Only Is this call to report lab results? No Return Phone Number [phone] (Primary) Initial Comment Caller states she is [place] CB# [phone]. Would like to let Dr. Abner Greenspan know the x-ray result. COPD w/ Hyperinflation. Nurse Assessment Guidelines Guideline Title Affirmed Question Affirmed Notes Nurse Date/Time (Eastern Time) Disp. Time Lamount Cohen Time) Disposition Final User 09/29/2014 7:06:53 PM Send to [facility] Paging Queue Rayder, Laurena Spies 09/29/2014 7:10:19 PM Paged On Call back to Call Center - PC Arlyss Gandy, [facility] 09/29/2014 7:20:49 PM Page Completed Yes Lamount Cranker After Care Instructions Given Call Event Type User Date / Time Description Paging DoctorName Phone DateTime Result/Outcome Message Type Notes Danise Edge  [phone] 09/29/2014 7:10:19 PM Paged On Call Back to Call Center Doctor Paged Please call Stefanee at [phone] Danise Edge 09/29/2014 7:19:22 PM Spoke with On Call - General Message Result Connected to facility.

## 2014-10-02 NOTE — Telephone Encounter (Signed)
Thanks ---sorry for any trouble. Family very involved--has been on hospice for end stage COPD for some time. Negative CXR was reassuring and I will check things there today

## 2014-10-02 NOTE — Telephone Encounter (Signed)
No worries. Glad she has you and her family.

## 2014-10-02 NOTE — Telephone Encounter (Signed)
Will review status today at [facility]

## 2014-10-02 NOTE — Telephone Encounter (Signed)
PLEASE NOTE: All timestamps contained within this report are represented as [place] Standard Time. CONFIDENTIALTY NOTICE: This fax transmission is intended only for the addressee. It contains information that is legally privileged, confidential or otherwise protected from use or disclosure. If you are not the intended recipient, you are strictly prohibited from reviewing, disclosing, copying using or disseminating any of this information or taking any action in reliance on or regarding this information. If you have received this fax in error, please notify us immediately by telephone so that we can arrange for its return to [place]. Phone: [phone], Toll-Free: [phone], Fax: [phone] Page: 1 of 1 Call Id: [id number] [facility] Night - Client TELEPHONE ADVICE RECORD [facility] Medical Call Center Patient Name: [name] Gender: Female DOB: 01-16-1922 Age: 81 Y 2 M 12 D Return Phone Number: [phone] (Primary) Address: City/State/Zip: [place] Client [facility] [facility] Night - Client Client Site [place] [facility] [place] - Night Physician Tillman Abide Contact Type Call Call Type Page Only Caller Name Juliette Alcide Relationship To Patient Care Giver Is this call to report lab results? No Return Phone Number [phone] (Primary) Initial Comment Juliette Alcide from [facility] @ [phone] states she hears crackles in the lungs, temp 99.8, she has spoken with Hospice and the family is still requesting a chest x-ray as she has a cardiac hx Nurse Assessment Guidelines Guideline Title Affirmed Question Affirmed Notes Nurse Date/Time (Eastern Time) Disp. Time Lamount Cohen Time) Disposition Final User 09/29/2014 2:36:50 PM Send to [facility] Paging Queue Alfonse Alpers 09/29/2014 2:39:15 PM Paged On Call back to Call Center - PC Burt Ek 09/29/2014 2:39:25 PM Paged On Call back to Call Center - PC Burt Ek 09/29/2014 2:55:19 PM Page Completed  Yes Ulice Dash After Care Instructions Given Call Event Type User Date / Time Description Paging DoctorName Phone DateTime Result/Outcome Message Type Notes Danise Edge [phone] 09/29/2014 2:39:15 PM Paged On Call Back to Call Center Doctor Paged Please call Ashleigh with [facility] at [phone] Danise Edge 09/29/2014 2:55:44 PM Spoke with On Call - General Message Result Provided on-call with page details.

## 2014-10-02 NOTE — Telephone Encounter (Signed)
Received 3 phone calls from [facility] regarding this patient. First call was regarding family request for CXR to evaluate patient change in status on 9/3. She had been fine in am but by after noon was coughing some and producing white sputum and hypoxic down to 88 % on 2 L. No choking, fevers other sign of illness. CXR was unremarkable on call back. Patient is declining per nursing, [place] Field and family so Hospice is following and no further orders were given.

## 2014-10-03 DIAGNOSIS — F39 Unspecified mood [affective] disorder: Secondary | ICD-10-CM

## 2014-10-03 DIAGNOSIS — K219 Gastro-esophageal reflux disease without esophagitis: Secondary | ICD-10-CM | POA: Diagnosis not present

## 2014-10-03 DIAGNOSIS — I4891 Unspecified atrial fibrillation: Secondary | ICD-10-CM

## 2014-10-03 DIAGNOSIS — I5030 Unspecified diastolic (congestive) heart failure: Secondary | ICD-10-CM | POA: Diagnosis not present

## 2014-10-03 DIAGNOSIS — I2510 Atherosclerotic heart disease of native coronary artery without angina pectoris: Secondary | ICD-10-CM

## 2014-10-03 DIAGNOSIS — M545 Low back pain: Secondary | ICD-10-CM

## 2014-10-03 DIAGNOSIS — J449 Chronic obstructive pulmonary disease, unspecified: Secondary | ICD-10-CM

## 2014-10-29 DIAGNOSIS — J9610 Chronic respiratory failure, unspecified whether with hypoxia or hypercapnia: Secondary | ICD-10-CM | POA: Diagnosis not present

## 2014-10-29 DIAGNOSIS — J441 Chronic obstructive pulmonary disease with (acute) exacerbation: Secondary | ICD-10-CM | POA: Diagnosis not present

## 2014-10-29 DIAGNOSIS — Z9981 Dependence on supplemental oxygen: Secondary | ICD-10-CM | POA: Diagnosis not present

## 2014-10-29 DIAGNOSIS — R918 Other nonspecific abnormal finding of lung field: Secondary | ICD-10-CM

## 2014-11-29 DIAGNOSIS — J439 Emphysema, unspecified: Secondary | ICD-10-CM | POA: Diagnosis not present

## 2014-11-29 DIAGNOSIS — F39 Unspecified mood [affective] disorder: Secondary | ICD-10-CM

## 2014-11-29 DIAGNOSIS — I25119 Atherosclerotic heart disease of native coronary artery with unspecified angina pectoris: Secondary | ICD-10-CM

## 2014-11-29 DIAGNOSIS — I5032 Chronic diastolic (congestive) heart failure: Secondary | ICD-10-CM

## 2014-11-29 DIAGNOSIS — J9611 Chronic respiratory failure with hypoxia: Secondary | ICD-10-CM

## 2014-11-29 DIAGNOSIS — I482 Chronic atrial fibrillation: Secondary | ICD-10-CM | POA: Diagnosis not present

## 2014-12-27 DIAGNOSIS — Z9981 Dependence on supplemental oxygen: Secondary | ICD-10-CM | POA: Diagnosis not present

## 2014-12-27 DIAGNOSIS — R918 Other nonspecific abnormal finding of lung field: Secondary | ICD-10-CM

## 2014-12-27 DIAGNOSIS — J9610 Chronic respiratory failure, unspecified whether with hypoxia or hypercapnia: Secondary | ICD-10-CM | POA: Diagnosis not present

## 2014-12-27 DIAGNOSIS — J441 Chronic obstructive pulmonary disease with (acute) exacerbation: Secondary | ICD-10-CM | POA: Diagnosis not present

## 2015-02-15 DIAGNOSIS — J449 Chronic obstructive pulmonary disease, unspecified: Secondary | ICD-10-CM

## 2015-02-15 DIAGNOSIS — M1990 Unspecified osteoarthritis, unspecified site: Secondary | ICD-10-CM

## 2015-02-15 DIAGNOSIS — I2510 Atherosclerotic heart disease of native coronary artery without angina pectoris: Secondary | ICD-10-CM | POA: Diagnosis not present

## 2015-02-15 DIAGNOSIS — J9611 Chronic respiratory failure with hypoxia: Secondary | ICD-10-CM

## 2015-02-15 DIAGNOSIS — K219 Gastro-esophageal reflux disease without esophagitis: Secondary | ICD-10-CM | POA: Diagnosis not present

## 2015-02-15 DIAGNOSIS — F323 Major depressive disorder, single episode, severe with psychotic features: Secondary | ICD-10-CM

## 2015-02-15 DIAGNOSIS — I4891 Unspecified atrial fibrillation: Secondary | ICD-10-CM | POA: Diagnosis not present

## 2015-02-15 DIAGNOSIS — I5030 Unspecified diastolic (congestive) heart failure: Secondary | ICD-10-CM | POA: Diagnosis not present

## 2015-02-15 DIAGNOSIS — I209 Angina pectoris, unspecified: Secondary | ICD-10-CM

## 2015-02-25 DIAGNOSIS — R918 Other nonspecific abnormal finding of lung field: Secondary | ICD-10-CM

## 2015-02-25 DIAGNOSIS — J9610 Chronic respiratory failure, unspecified whether with hypoxia or hypercapnia: Secondary | ICD-10-CM

## 2015-02-25 DIAGNOSIS — Z9981 Dependence on supplemental oxygen: Secondary | ICD-10-CM | POA: Diagnosis not present

## 2015-02-25 DIAGNOSIS — J441 Chronic obstructive pulmonary disease with (acute) exacerbation: Secondary | ICD-10-CM | POA: Diagnosis not present

## 2015-03-20 DIAGNOSIS — J9600 Acute respiratory failure, unspecified whether with hypoxia or hypercapnia: Secondary | ICD-10-CM | POA: Diagnosis not present

## 2015-03-20 DIAGNOSIS — I5030 Unspecified diastolic (congestive) heart failure: Secondary | ICD-10-CM | POA: Diagnosis not present

## 2015-03-20 DIAGNOSIS — J449 Chronic obstructive pulmonary disease, unspecified: Secondary | ICD-10-CM | POA: Diagnosis not present

## 2015-04-22 ENCOUNTER — Telehealth: Payer: Self-pay | Admitting: Internal Medicine

## 2015-04-22 NOTE — Telephone Encounter (Signed)
Patient's daughter,Anne,called.  She just received patient's death certificate and she has a question about it.  Please call Thurston Holenne.

## 2015-04-22 NOTE — Telephone Encounter (Signed)
Daughter voices real satisfaction about [facility] care [facility] Reviewed the death certificate--- mistakenly stated it wasn't related to cigarette smoking. Discussed that we don't need to change this (not sure if she told me this and I missed it--or she didn't tell me when I admitted her)

## 2015-04-27 DEATH — deceased

## 2015-12-28 IMAGING — CR DG CHEST 1V PORT
1 series · 1 of 1 positions shown · non-contrast
Comparison: 07/24/2013

CLINICAL DATA: Chest pain.

EXAM:
PORTABLE CHEST - 1 VIEW

[ap]
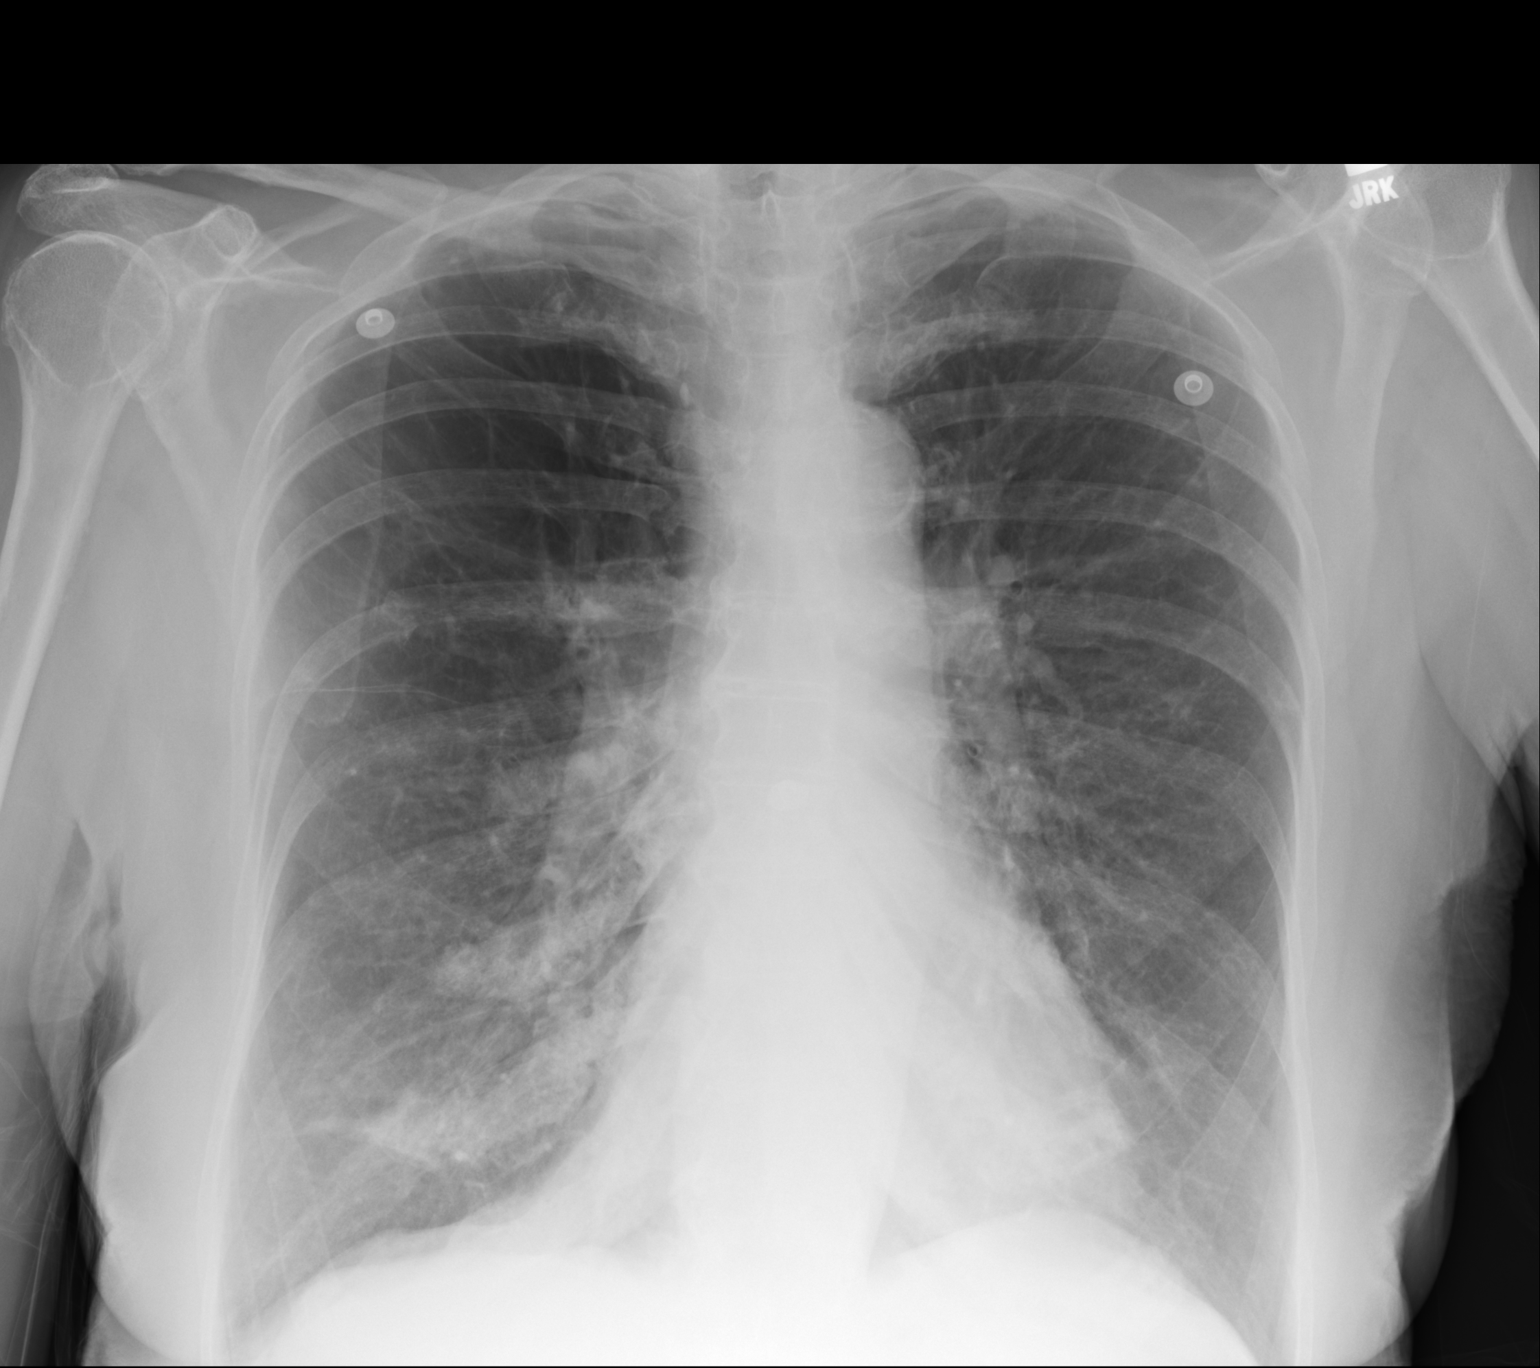

[1 of 1 positions shown; findings below may reference images not displayed]

FINDINGS: There is hyperinflation of the lungs compatible with COPD. Heart is
normal size. Predominately linear density at the right lung base,
likely atelectasis. No focal opacity on the left. No effusions. No
acute bony abnormality.
IMPRESSION: COPD.  Right base atelectasis.

## 2016-06-01 IMAGING — CR DG CHEST 2V
1 series · 2 of 2 positions shown · non-contrast
Comparison: 01/16/2014

CLINICAL DATA: Chest pain radiating to the back.

EXAM:
CHEST  2 VIEW

[Series 1: x chest ap · 0.14mm/px · 2 of 2 slices shown]
[im 1/2]
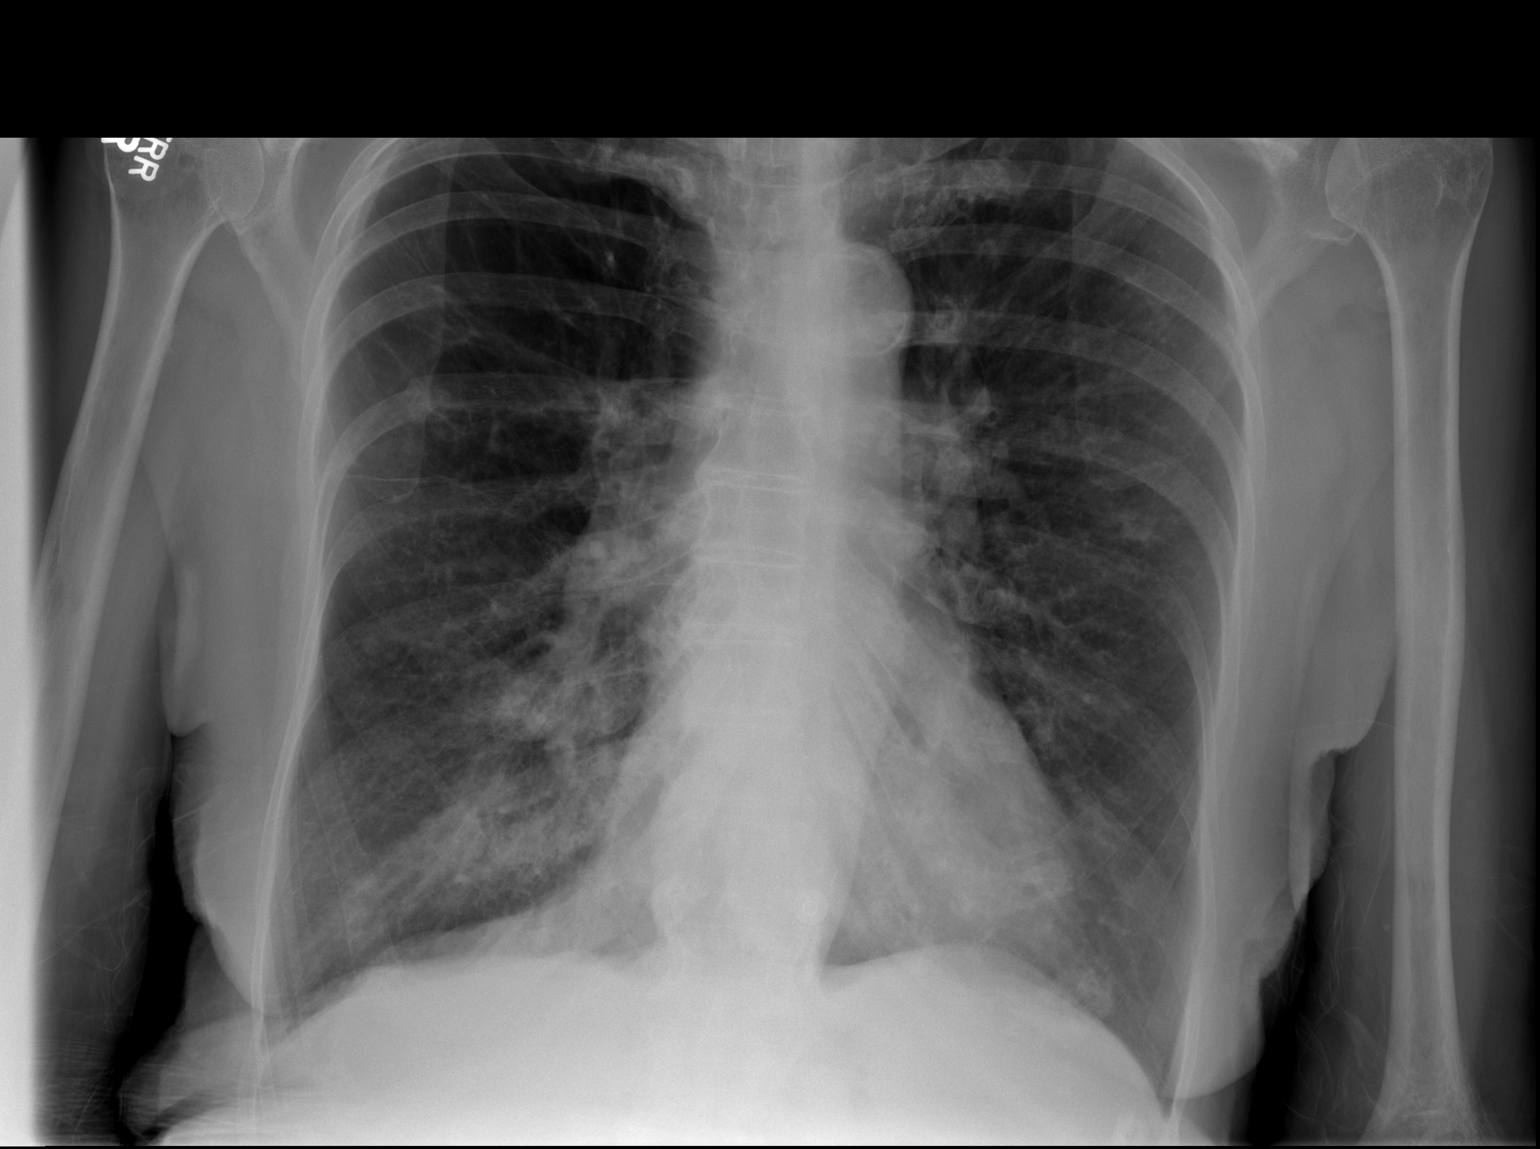
[im 2/2]
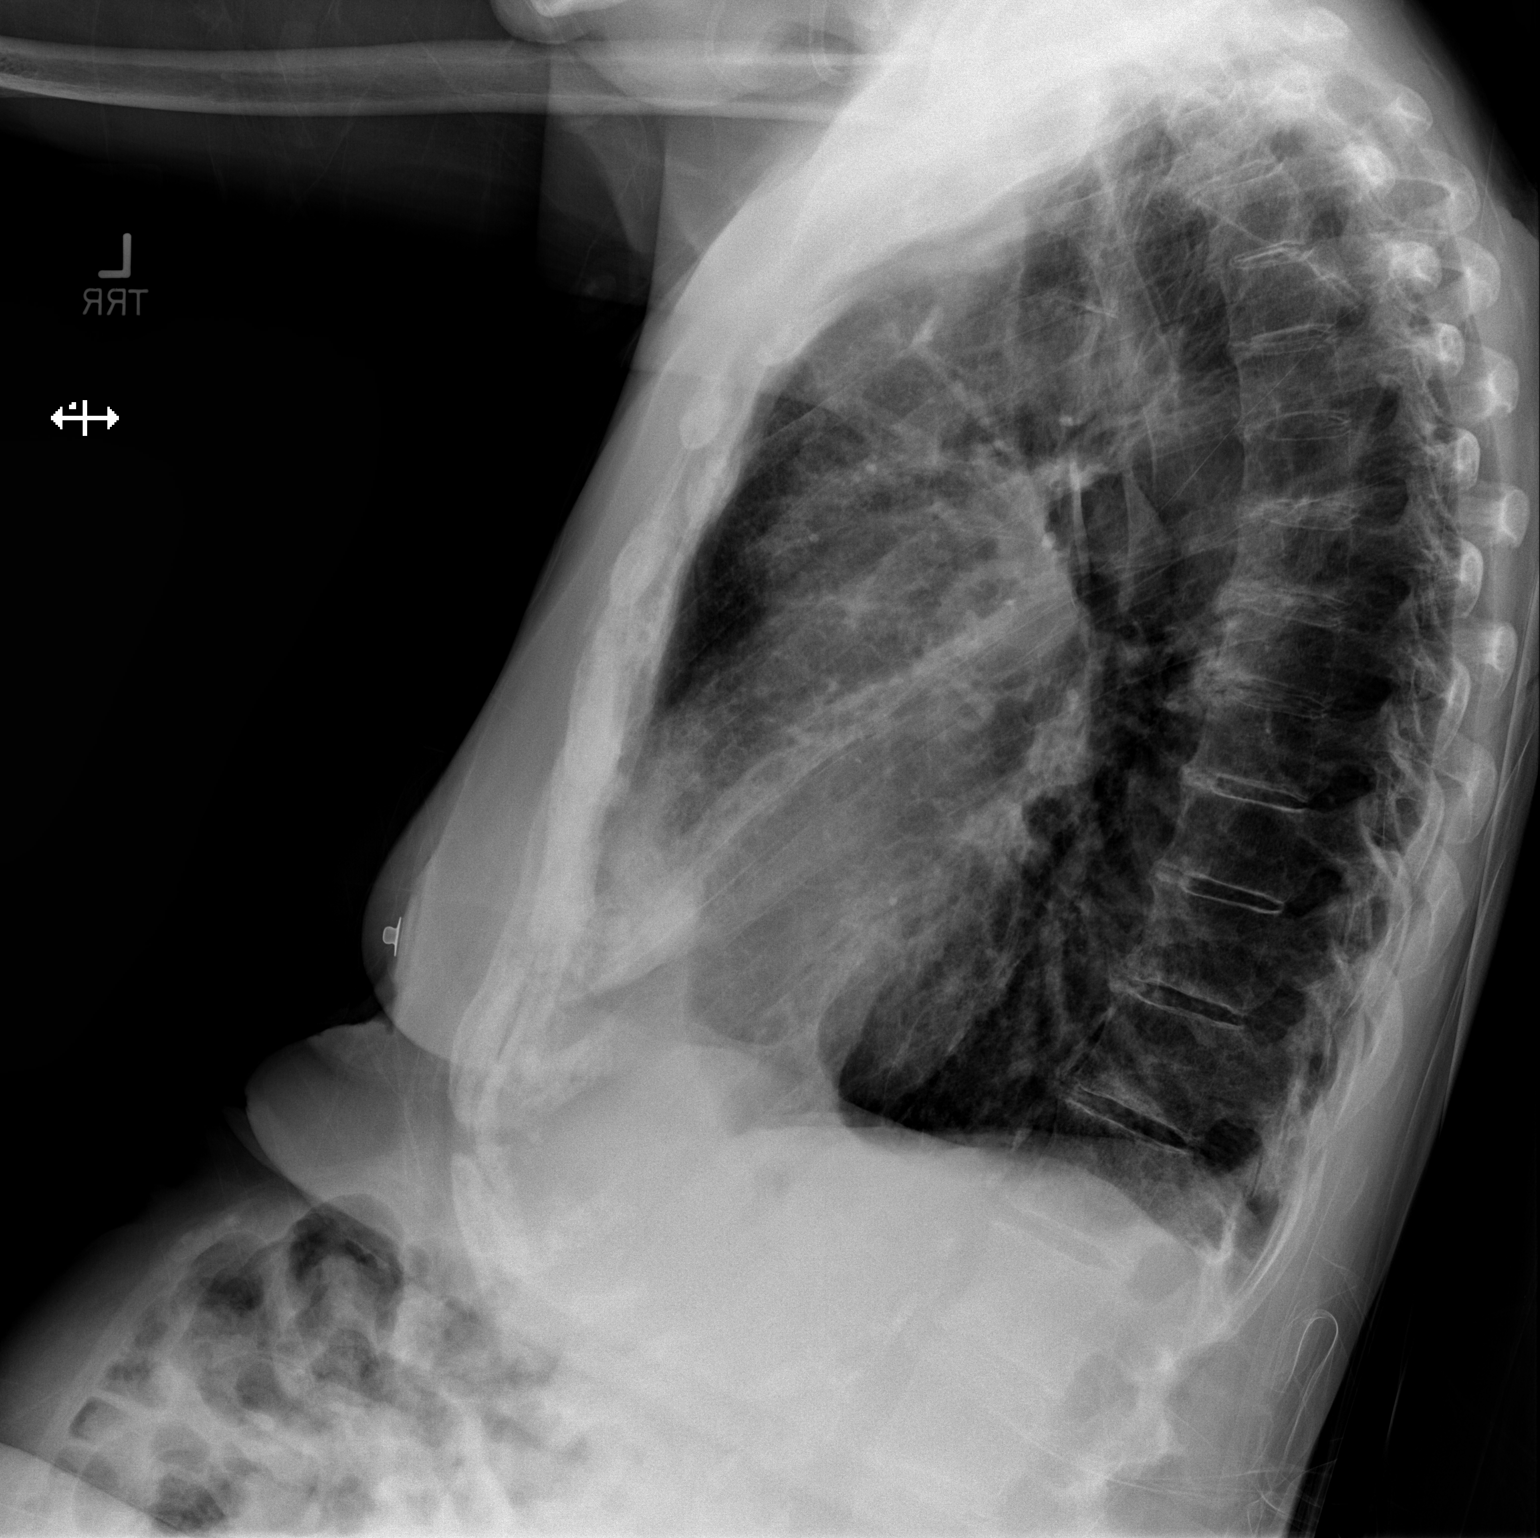

[2 of 2 positions shown; findings below may reference images not displayed]

FINDINGS: The lungs are hyperinflated likely secondary to COPD. There are
chronic bilateral interstitial changes. Right middle lobe airspace
disease concerning for pneumonia. No pleural effusion or
pneumothorax. Stable cardiomediastinal silhouette.

No acute osseous abnormality.
IMPRESSION: Right middle lobe airspace disease concerning for pneumonia.

## 2016-08-21 IMAGING — CR DG CHEST 1V PORT
1 series · 1 of 1 positions shown · non-contrast
Comparison: Chest radiograph performed 02/04/2014

CLINICAL DATA: Midsternal chest pain, radiating down the right arm.
Initial encounter.

EXAM:
PORTABLE CHEST - 1 VIEW

[ap]
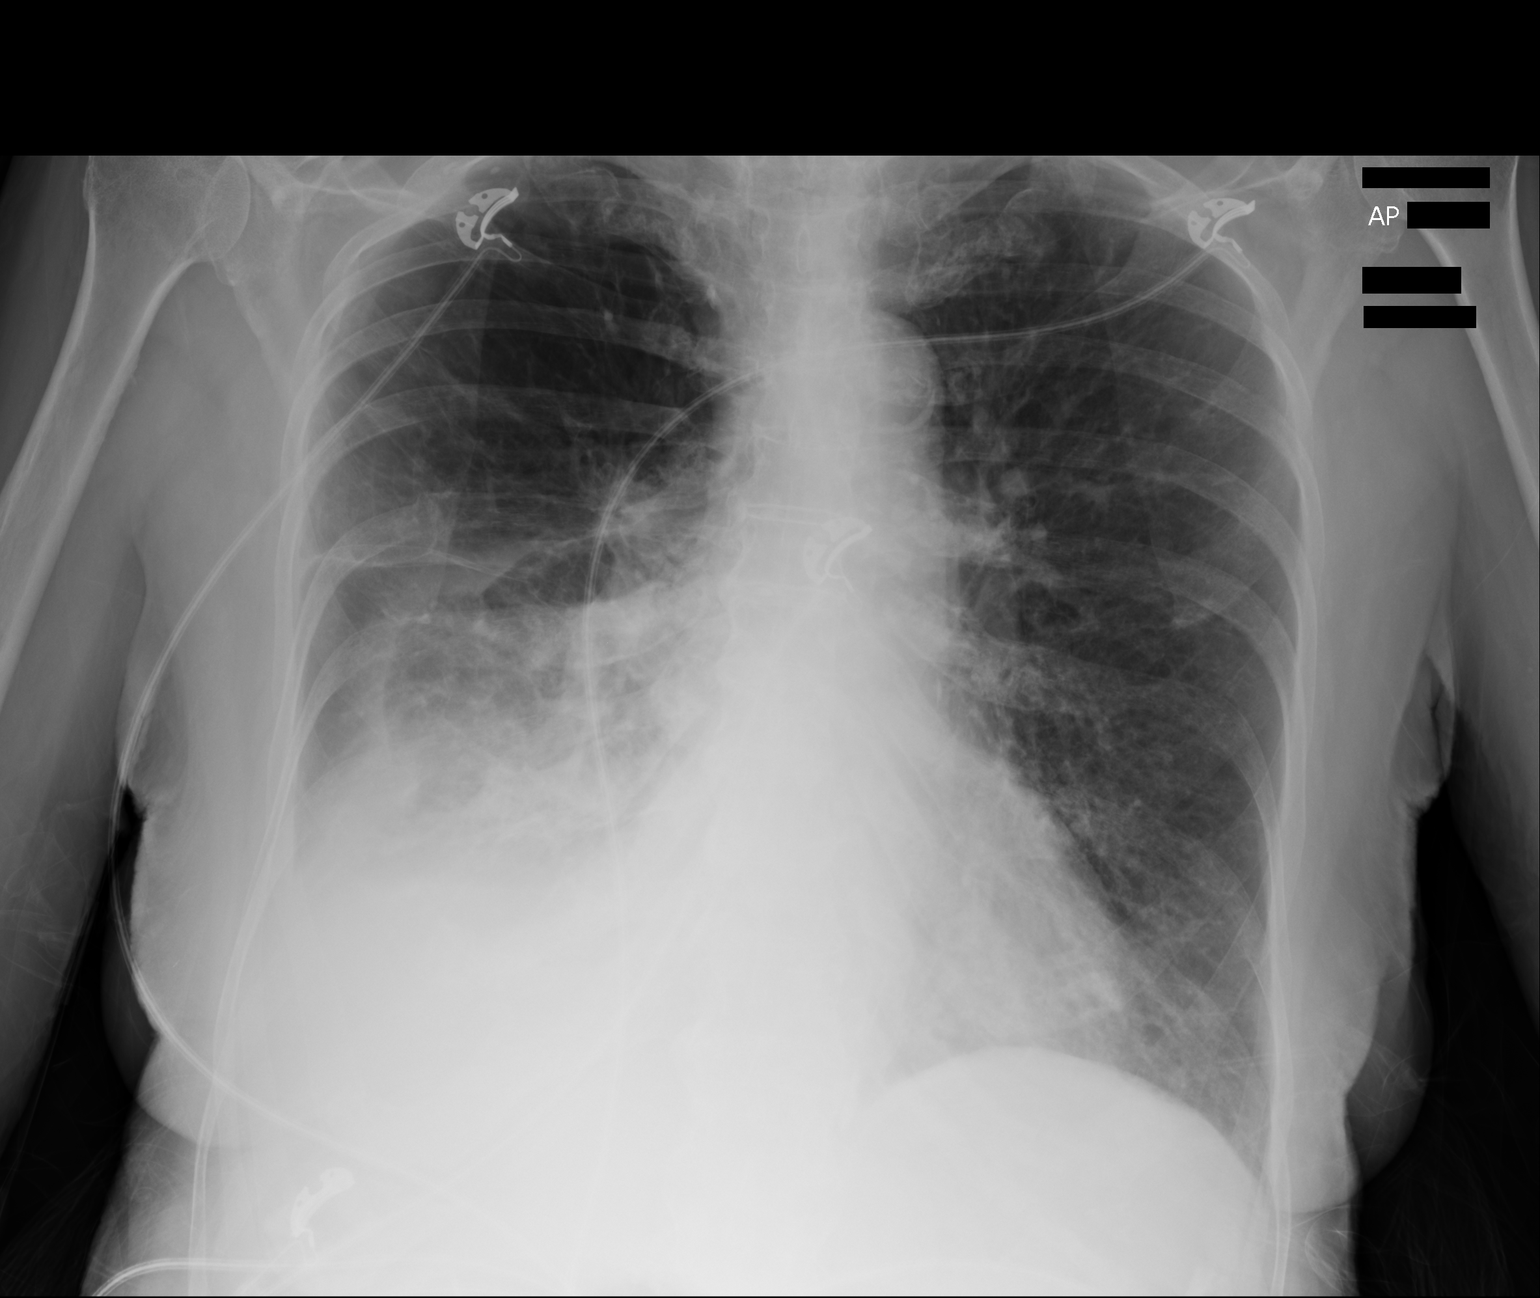

[1 of 1 positions shown; findings below may reference images not displayed]

FINDINGS: The lungs are well-aerated. A small to moderate right-sided pleural
effusion is noted, with right basilar airspace opacification.

Minimal left basilar atelectasis is noted. No pneumothorax is seen.
The known left-sided pulmonary nodules are not well characterized on
radiograph.

The cardiomediastinal silhouette is borderline enlarged. No acute
osseous abnormalities are seen.
IMPRESSION: 1. New small to moderate right-sided pleural effusion, with right
basilar airspace opacification. This may reflect pneumonia, though
would consider follow-up chest radiograph after completion of
treatment to ensure resolution of airspace opacity, as deemed
clinically appropriate.
2. Known left-sided pulmonary nodules are not well characterized on
radiograph.
3. Minimal left basilar atelectasis is noted.
4. Borderline cardiomegaly.

## 2016-09-11 IMAGING — CR DG CHEST 1V
1 series · 1 of 1 positions shown · non-contrast
Comparison: 04/26/2014

CLINICAL DATA: Worsening shortness of breath.  Pneumonia.

EXAM:
CHEST  1 VIEW

[ap]
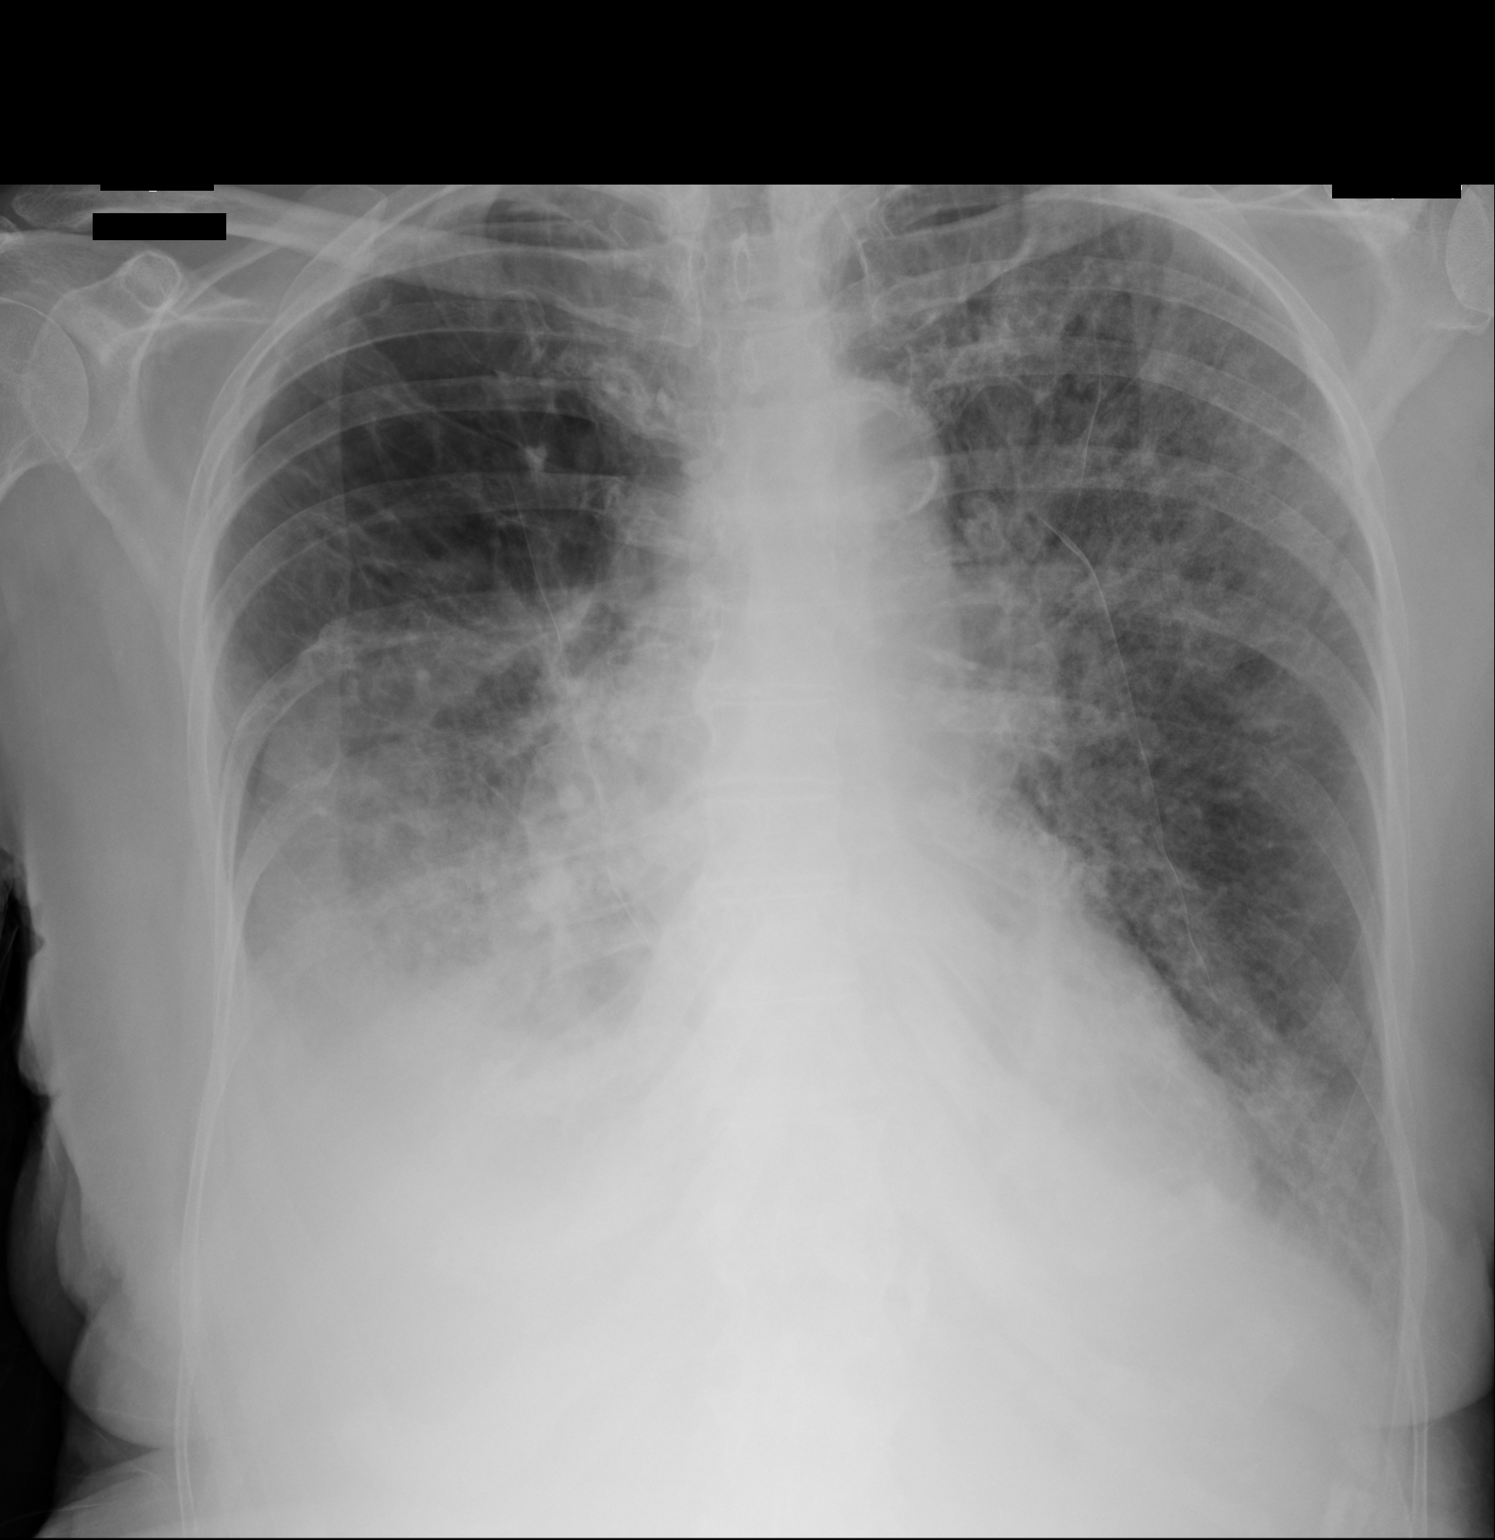

[1 of 1 positions shown; findings below may reference images not displayed]

FINDINGS: Increased bilateral airspace disease is seen. Underlying pulmonary
emphysema noted. Small to moderate right pleural effusion shows no
significant change. Heart size is stable. No pneumothorax
identified.
IMPRESSION: Increased bilateral airspace disease superimposed on pulmonary
emphysema. This is suspicious for acute pulmonary edema, with
infection considered less likely.

Small to moderate right pleural effusion.
